# Patient Record
Sex: Male | Born: 1985 | Race: White | Hispanic: No | Marital: Single | Smoking: Current every day smoker
Health system: Southern US, Community
[De-identification: ages and names within clinical notes are randomized; demographics above are authoritative.]

## PROBLEM LIST (undated history)

## (undated) DIAGNOSIS — R52 Pain, unspecified: Secondary | ICD-10-CM

---

## 2007-03-03 ENCOUNTER — Emergency Department (HOSPITAL_COMMUNITY): Admission: EM | Admit: 2007-03-03 | Discharge: 2007-03-04 | Payer: Self-pay | Admitting: Emergency Medicine

## 2007-04-01 ENCOUNTER — Emergency Department (HOSPITAL_COMMUNITY): Admission: EM | Admit: 2007-04-01 | Discharge: 2007-04-01 | Payer: Self-pay | Admitting: Emergency Medicine

## 2007-07-03 ENCOUNTER — Emergency Department (HOSPITAL_COMMUNITY): Admission: EM | Admit: 2007-07-03 | Discharge: 2007-07-04 | Payer: Self-pay | Admitting: Emergency Medicine

## 2007-07-06 ENCOUNTER — Emergency Department (HOSPITAL_COMMUNITY): Admission: EM | Admit: 2007-07-06 | Discharge: 2007-07-06 | Payer: Self-pay | Admitting: Emergency Medicine

## 2008-12-25 IMAGING — CR DG CHEST 1V PORT
2 series · 2 of 2 positions shown · non-contrast
Comparison: None.

CLINICAL DATA: Right-sided chest pain. Shortness of breath.

PORTABLE CHEST - 1 VIEW  [DATE]/4333 [id number] hours:

[view not recorded (1 of 2)]
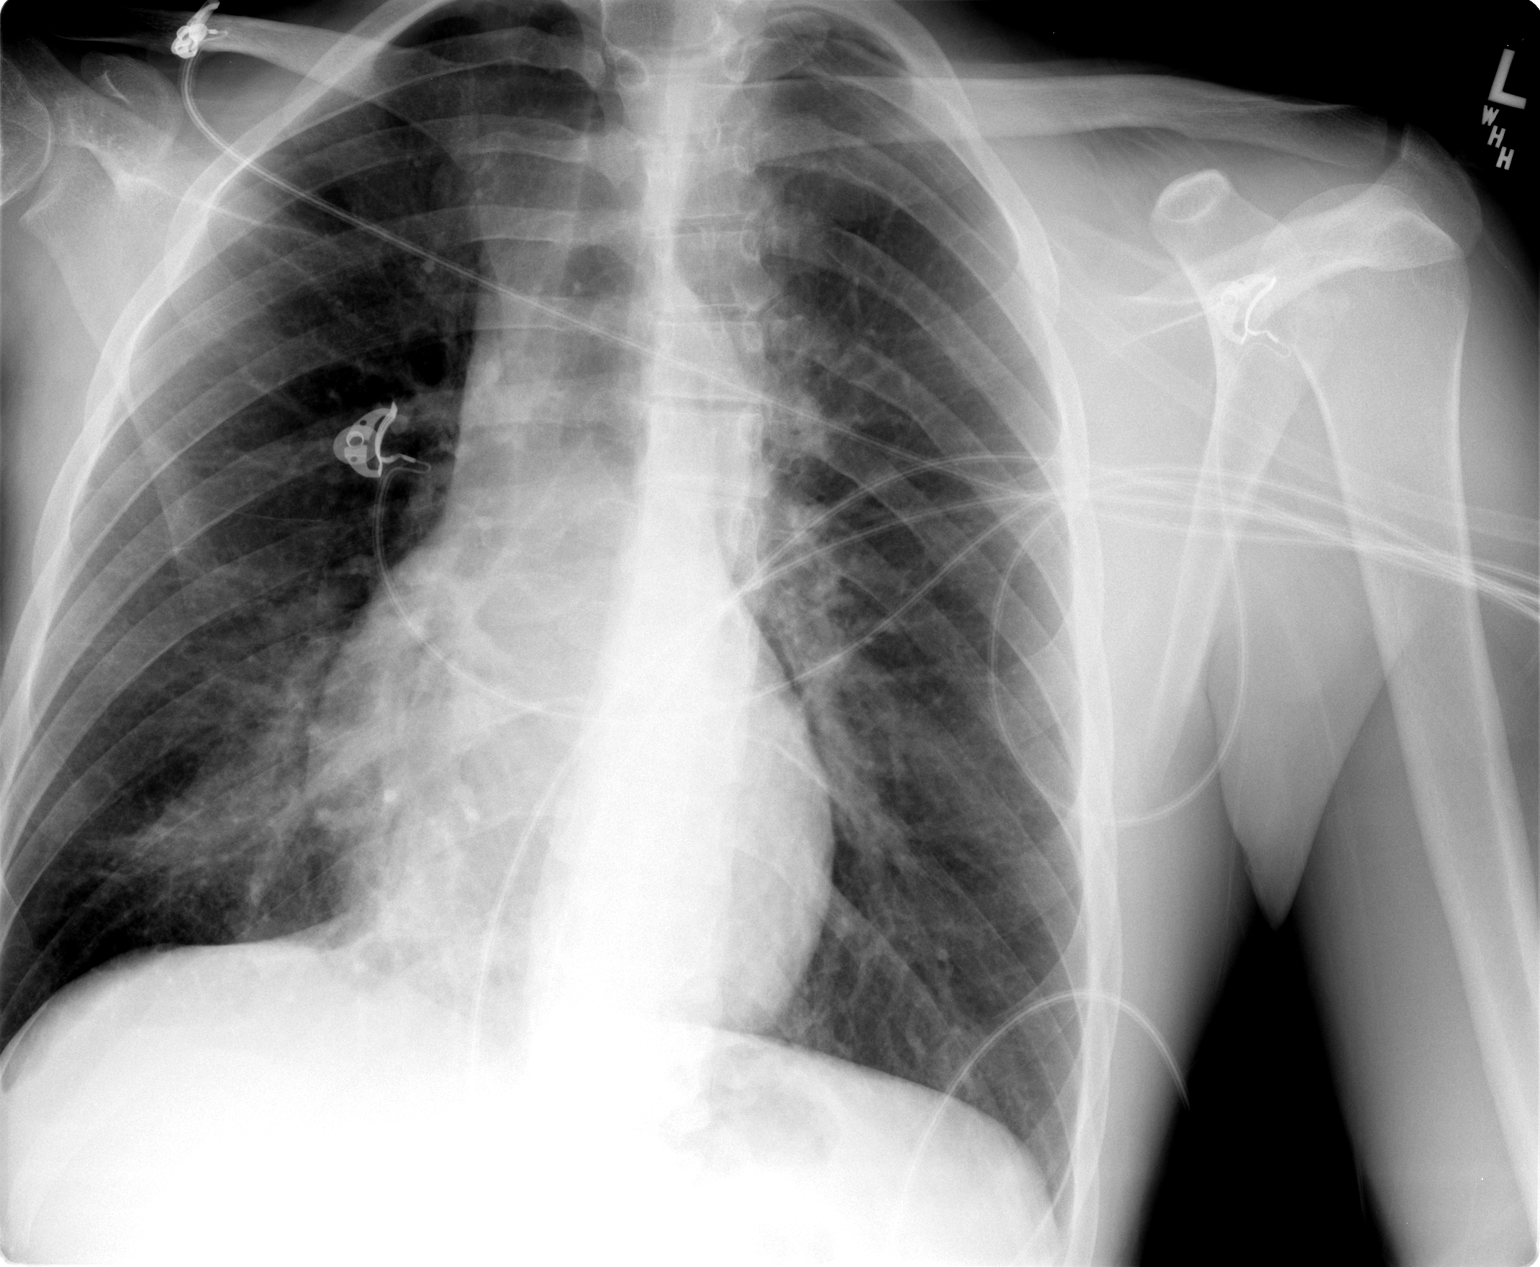

[view not recorded (2 of 2)]
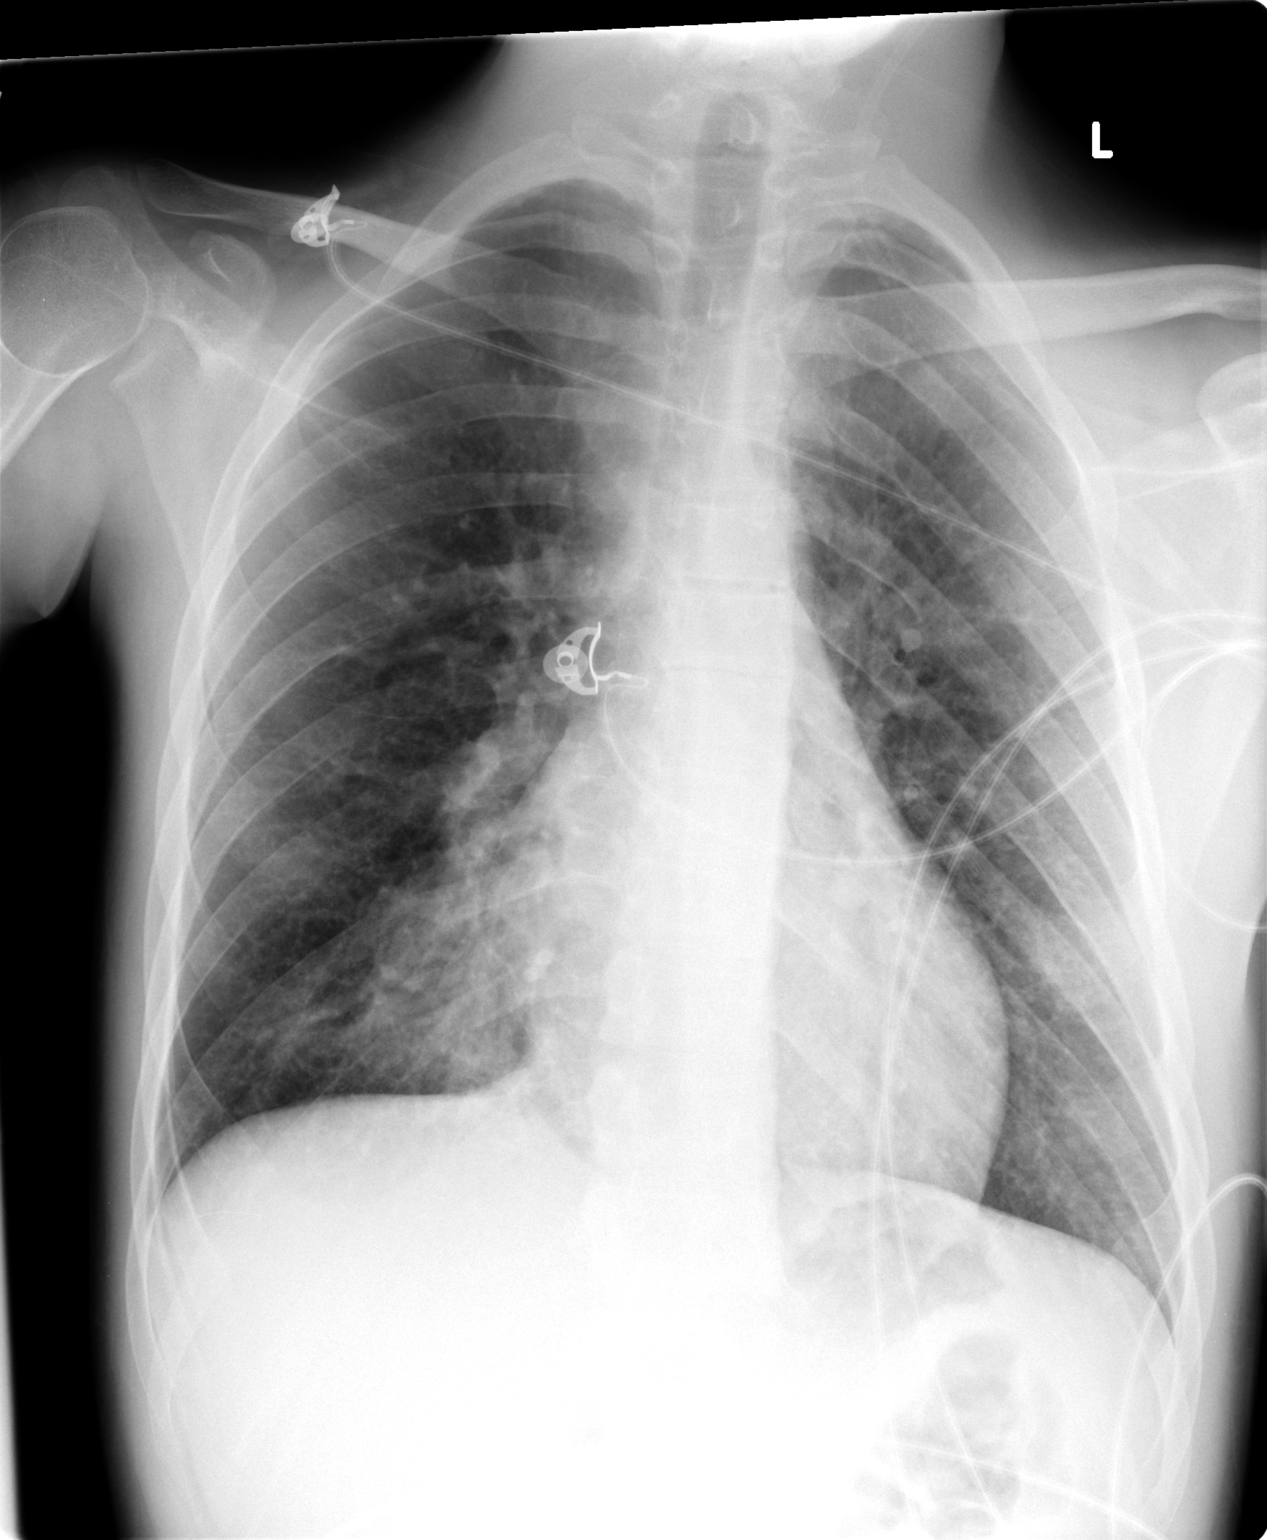

[2 of 2 positions shown; findings below may reference images not displayed]

FINDINGS: Airspace consolidation in the right middle lobe with silhouetting of
the right heart border. Lungs otherwise clear. No pleural effusions.
Cardiomediastinal silhouette unremarkable for technique.
IMPRESSION: Right middle lobe pneumonia.

## 2012-07-28 ENCOUNTER — Emergency Department (HOSPITAL_COMMUNITY)
Admission: EM | Admit: 2012-07-28 | Discharge: 2012-07-29 | Disposition: A | Discharge status: Home / Self Care | Payer: Medicaid Other | Attending: Emergency Medicine | Admitting: Emergency Medicine

## 2012-07-28 ENCOUNTER — Encounter (HOSPITAL_COMMUNITY): Payer: Self-pay | Admitting: Emergency Medicine

## 2012-07-28 DIAGNOSIS — R0789 Other chest pain: Secondary | ICD-10-CM

## 2012-07-28 DIAGNOSIS — S48119A Complete traumatic amputation at level between unspecified shoulder and elbow, initial encounter: Secondary | ICD-10-CM | POA: Insufficient documentation

## 2012-07-28 DIAGNOSIS — F17200 Nicotine dependence, unspecified, uncomplicated: Secondary | ICD-10-CM | POA: Insufficient documentation

## 2012-07-28 DIAGNOSIS — J189 Pneumonia, unspecified organism: Secondary | ICD-10-CM | POA: Insufficient documentation

## 2012-07-28 DIAGNOSIS — R079 Chest pain, unspecified: Secondary | ICD-10-CM | POA: Insufficient documentation

## 2012-07-28 DIAGNOSIS — R42 Dizziness and giddiness: Secondary | ICD-10-CM | POA: Insufficient documentation

## 2012-07-28 NOTE — ED Notes (Signed)
PER EMS- Pt found on floor where he resides. Pt was alertx4, NAD. Pt c/o chest pressure that radiates down L arm. Patient denies n/v/d. No SOB noted. Friend on scene states that patient was drinking alcohol mixed with some sort of energy drink. Pt currently alertx4, NAD.

## 2012-07-29 ENCOUNTER — Emergency Department (HOSPITAL_COMMUNITY): Payer: Medicaid Other

## 2012-07-29 LAB — URINALYSIS, ROUTINE W REFLEX MICROSCOPIC
Glucose, UA: NEGATIVE mg/dL
Ketones, ur: NEGATIVE mg/dL
Leukocytes, UA: NEGATIVE
Nitrite: NEGATIVE
Protein, ur: NEGATIVE mg/dL
pH: 6.5 (ref 5.0–8.0)

## 2012-07-29 LAB — CBC
HCT: 35.7 % — ABNORMAL LOW (ref 39.0–52.0)
MCH: 32.5 pg (ref 26.0–34.0)
MCV: 96.7 fL (ref 78.0–100.0)
Platelets: 139 10*3/uL — ABNORMAL LOW (ref 150–400)
RDW: 13 % (ref 11.5–15.5)

## 2012-07-29 LAB — COMPREHENSIVE METABOLIC PANEL
AST: 15 U/L (ref 0–37)
Albumin: 3.6 g/dL (ref 3.5–5.2)
BUN: 12 mg/dL (ref 6–23)
Calcium: 9.3 mg/dL (ref 8.4–10.5)
Creatinine, Ser: 0.77 mg/dL (ref 0.50–1.35)
GFR calc non Af Amer: >90 mL/min (ref >90)

## 2012-07-29 LAB — MAGNESIUM: Magnesium: 1.7 mg/dL (ref 1.5–2.5)

## 2012-07-29 LAB — RAPID URINE DRUG SCREEN, HOSP PERFORMED
Amphetamines: NOT DETECTED
Opiates: NOT DETECTED

## 2012-07-29 LAB — D-DIMER, QUANTITATIVE: D-Dimer, Quant: <0.27 ug/mL-FEU (ref 0.00–0.48)

## 2012-07-29 LAB — ETHANOL: Alcohol, Ethyl (B): <11 mg/dL (ref 0–11)

## 2012-07-29 LAB — POCT I-STAT TROPONIN I: Troponin i, poc: 0 ng/mL (ref 0.00–0.08)

## 2012-07-29 MED ORDER — AZITHROMYCIN 250 MG PO TABS: 250.0000 mg | ORAL_TABLET | Freq: Every day | ORAL | Status: DC

## 2012-07-29 MED ORDER — SODIUM CHLORIDE 0.9 % IV BOLUS (SEPSIS)
1000.0000 mL | Freq: Once | INTRAVENOUS | Status: AC
  Administered 2012-07-29: 1000 mL via INTRAVENOUS

## 2012-07-29 MED ORDER — LEVOFLOXACIN 750 MG PO TABS
750.0000 mg | ORAL_TABLET | Freq: Once | ORAL | Status: AC
  Administered 2012-07-29: 750 mg via ORAL
  Filled 2012-07-29: qty 1

## 2012-07-29 MED ORDER — AZITHROMYCIN 250 MG PO TABS
500.0000 mg | ORAL_TABLET | Freq: Every day | ORAL | Status: AC
  Administered 2012-07-29: 500 mg via ORAL
  Filled 2012-07-29: qty 3

## 2012-07-29 MED ORDER — CLOPIDOGREL BISULFATE 75 MG PO TABS: 75.0000 mg | ORAL_TABLET | Freq: Once | ORAL | Status: DC

## 2012-07-29 MED ORDER — AZITHROMYCIN 250 MG PO TABS: ORAL_TABLET | ORAL | Status: DC

## 2012-07-29 NOTE — ED Notes (Signed)
Walked pt around nurses station, pt tolerated
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
## 2014-07-28 ENCOUNTER — Inpatient Hospital Stay: Admit: 2014-07-28 | Discharge: 2014-07-28 | Disposition: A | Discharge status: Home / Self Care

## 2014-07-28 MED ORDER — NAPROXEN 500 MG PO TABS
500 MG | ORAL_TABLET | Freq: Two times a day (BID) | ORAL | Status: DC
Start: 2014-07-28 — End: 2014-12-10

## 2014-07-28 MED ORDER — HYDROCODONE-ACETAMINOPHEN 5-325 MG PO TABS
5-325 MG | ORAL_TABLET | Freq: Four times a day (QID) | ORAL | Status: AC | PRN
Start: 2014-07-28 — End: 2014-07-31

## 2014-07-28 MED ADMIN — HYDROcodone-acetaminophen (NORCO) 5-325 MG per tablet 1 tablet: 1 | ORAL | @ 20:00:00 | NDC 00406012323

## 2014-07-28 MED FILL — HYDROCODONE-ACETAMINOPHEN 5-325 MG PO TABS: 5-325 MG | ORAL | Qty: 1

## 2014-07-28 NOTE — ED Notes (Signed)
Thinks he sprained left ankle, stepped off a step wrong.     Virgina Norfolkebecca M Babs Dabbs, RN  07/28/14 [phone]

## 2014-07-28 NOTE — Discharge Instructions (Signed)
Ankle Sprain: Care Instructions  Your Care Instructions     An ankle sprain can happen when you twist your ankle. The ligaments that support the ankle can get stretched and torn. Often the ankle is swollen and painful.  Ankle sprains may take from several weeks to several months to heal. Usually, the more pain and swelling you have, the more severe your ankle sprain is and the longer it will take to heal. You can heal faster and regain strength in your ankle with good home treatment.  It is very important to give your ankle time to heal completely, so that you do not easily hurt your ankle again.  Follow-up care is a key part of your treatment and safety. Be sure to make and go to all appointments, and call your doctor if you are having problems. It's also a good idea to know your test results and keep a list of the medicines you take.  How can you care for yourself at home?   Prop up your foot on pillows as much as possible for the next 3 days. Try to keep your ankle above the level of your heart. This will help reduce the swelling.   Follow your doctor's directions for wearing a splint or elastic bandage. Wrapping the ankle may help reduce or prevent swelling.   Your doctor may give you a splint, a brace, an air stirrup, or another form of ankle support to protect your ankle until it is healed. Wear it as directed while your ankle is healing. Do not remove it unless your doctor tells you to. After your ankle has healed, ask your doctor whether you should wear the brace when you exercise.   Put ice or cold packs on your injured ankle for 10 to 20 minutes at a time. Try to do this every 1 to 2 hours for the next 3 days (when you are awake) or until the swelling goes down. Put a thin cloth between the ice and your skin.   You may need to use crutches until you can walk without pain. If you do use crutches, try to bear some weight on your injured ankle if you can do so without pain. This helps the ankle  heal.   Take pain medicines exactly as directed.   If the doctor gave you a prescription medicine for pain, take it as prescribed.   If you are not taking a prescription pain medicine, ask your doctor if you can take an over-the-counter medicine.   If you have been given ankle exercises to do at home, do them exactly as instructed. These can promote healing and help prevent lasting weakness.  When should you call for help?  Call your doctor now or seek immediate medical care if:   Your pain is getting worse.   Your swelling is getting worse.   Your splint feels too tight or you are unable to loosen it.  Watch closely for changes in your health, and be sure to contact your doctor if:   You are not getting better after 1 week.   Where can you learn more?   Go to [email or web address] and sign in to your MyChart account. Enter S771 in the Search Health Information box to learn more about "Ankle Sprain: Care Instructions."    If you do not have an account, please click on the "Sign Up Now" link.      2006-2015 [employer]. Care instructions adapted under license by [facility]. This care   instruction is for use with your licensed healthcare professional. If you have questions about a medical condition or this instruction, always ask your healthcare professional. [employer], Inc[employer] disclaims any warranty or liability for your use of this information.  Content Version: 10.6.465758; Current as of: Mar 20, 2014

## 2014-07-28 NOTE — ED Provider Notes (Signed)
[facility]      29M Emergency??  Department of Emergency Medicine ED  Provider Note  Admit Date/RoomTime: 07/28/2014  3:52 PM  ED Room: 29/29  9/29/154:18 PM    Chief Complaint:   No chief complaint on file.    History of Present Illness   Source of history provided by:  patient.  History/Exam Limitations: none.        Klayten Swaziland is a 28 y.o. old male presenting to the emergency department by private vehicle, for left ankle pain which occured 2 day(s) prior to arrival.  Cause of complaint: twisting injury while at home.  There has been a history of no prior problems with this area in the past.  Since onset the symptoms have been moderate in degree with ability to bear weight, but with some pain, bruising and swelling.       ROS    Pertinent positives and negatives are stated within HPI, all other systems reviewed and are negative.    Past Surgical History   Procedure Laterality Date   ??? Arm amputation       Rt above elbow age 56   Social History:  reports that he has been smoking Cigarettes.  He has been smoking about 0.50 packs per day. He does not have any smokeless tobacco history on file. He reports that he does not drink alcohol or use illicit drugs.  Family History: family history is not on file.   Allergies: Tramadol    Physical Exam         VS:  BP 101/47 mmHg   Pulse 103   Temp(Src) 98.2 ??F (36.8 ??C) (Oral)   Resp 18   Ht 6\' 2"  (1.88 m)   Wt 135 lb (61.236 kg)   BMI 17.33 kg/m2   SpO2 100%    Oxygen Saturation Interpretation: Normal.    Constitutional:  Alert, development consistent with age.  Neck:  Normal ROM.  Supple.   Ankle:  Left Lateral:              Tenderness:  moderate.              Swelling: Mild.              Deformity: no.             ROM: diminished range with pain.             Skin:  tenderness, swelling and bruising.       Neurovascular:              Motor deficit: none.             Sensory deficit:   none.            Pulse deficit: none.              Capillary refill: normal.  Knee:               Tenderness:  none.              Swelling: None.              Deformity: no.             ROM: full range of motion.             Skin:  no erythema, rash or wounds noted.   Foot:              Tenderness:  mild.  Swelling: None.              Deformity: no.             ROM: diminished range with pain.             Skin:  no erythema, rash or wounds noted.  Gait:  limp.   Lymphatics: No lymphangitis or adenopathy noted.  Neurological:  Oriented.  Motor functions intact.    Lab / Imaging Results   (All laboratory and radiology results have been personally reviewed by myself)  Labs:  No results found for this visit on 07/28/14.  Imaging:  All Radiology results interpreted by Radiologist unless otherwise noted.  XR ANKLE LEFT STANDARD    Final Result: IMPRESSION: No evidence of any acute fracture or subluxation.       XR FOOT LEFT STANDARD    Final Result: IMPRESSION: No evidence of any acute fracture or subluxation.         ED Course / Medical Decision Making     Medications   HYDROcodone-acetaminophen (NORCO) 5-325 MG per tablet 1 tablet (1 tablet Oral Given 07/28/14 1624)     Consults:   None    Procedure(s):   none    MDM:       Films were obtained and are negative for fracture. Plan is subsequently for symptom control, limited weight-bearing and  immobilization with appropriate outpatient follow-up.    Counseling:   The emergency provider has spoken with the patient and discussed today???s results, in addition to providing specific details for the plan of care and counseling regarding the diagnosis and prognosis.  Questions are answered at this time and they are agreeable with the plan.    Assessment      1. Sprain of ankle, unspecified site      Plan   Discharge to home  Patient condition is good    New Medications     New Prescriptions    HYDROCODONE-ACETAMINOPHEN (NORCO) 5-325 MG PER TABLET    Take 1 tablet by mouth every 6 hours as needed for Pain    NAPROXEN (NAPROSYN) 500 MG TABLET    Take 1  tablet by mouth 2 times daily for 7 days     Electronically signed by Tarry KosElizabeth Destaney Sarkis, NP   DD: 07/28/14  **This report was transcribed using voice recognition software. Every effort was made to ensure accuracy; however, inadvertent computerized transcription errors may be present.  END OF ED PROVIDER NOTE    Tarry KosElizabeth Nathasha Fiorillo, NP  07/28/14 1706

## 2014-11-09 ENCOUNTER — Inpatient Hospital Stay
Admit: 2014-11-09 | Discharge: 2014-11-10 | Disposition: A | Discharge status: Home / Self Care | Attending: Emergency Medicine

## 2014-11-09 ENCOUNTER — Encounter

## 2014-11-09 ENCOUNTER — Encounter: Admit: 2014-11-09

## 2014-11-09 DIAGNOSIS — R569 Unspecified convulsions: Secondary | ICD-10-CM

## 2014-11-09 LAB — URINE DRUG SCREEN
Amphetamine Screen, Urine: NOT DETECTED (ref <1000)
Barbiturate Screen, Ur: NOT DETECTED (ref <200)
Benzodiazepine Screen, Urine: NOT DETECTED (ref <200)
Cannabinoid Scrn, Ur: POSITIVE — AB
Cocaine Metabolite Screen, Urine: NOT DETECTED (ref <300)
Methadone Screen, Urine: NOT DETECTED (ref <300)
Opiate Scrn, Ur: NOT DETECTED
PCP Screen, Urine: NOT DETECTED (ref <25)
Propoxyphene Scrn, Ur: NOT DETECTED (ref <300)

## 2014-11-09 LAB — CBC WITH AUTO DIFFERENTIAL
Basophils %: 1 % (ref 0–2)
Basophils Absolute: 0.02 E9/L (ref 0.00–0.20)
Eosinophils %: 1 % (ref 0–6)
Eosinophils Absolute: 0.05 E9/L (ref 0.05–0.50)
Hematocrit: 36.7 % — ABNORMAL LOW (ref 37.0–54.0)
Hemoglobin: 12.4 g/dL — ABNORMAL LOW (ref 12.5–16.5)
Lymphocytes %: 54 % — ABNORMAL HIGH (ref 20–42)
Lymphocytes Absolute: 1.98 E9/L (ref 1.50–4.00)
MCH: 32.4 pg (ref 26.0–35.0)
MCHC: 33.6 % (ref 32.0–34.5)
MCV: 96.4 fL (ref 80.0–99.9)
MPV: 7.8 fL (ref 7.0–12.0)
Monocytes %: 6 % (ref 2–12)
Monocytes Absolute: 0.23 E9/L (ref 0.10–0.95)
Neutrophils %: 38 % — ABNORMAL LOW (ref 43–80)
Neutrophils Absolute: 1.41 E9/L — ABNORMAL LOW (ref 1.80–7.30)
Platelets: 156 E9/L (ref 130–450)
RBC: 3.81 E12/L (ref 3.80–5.80)
RDW: 14 fL (ref 11.5–15.0)
WBC: 3.7 E9/L — ABNORMAL LOW (ref 4.5–11.5)

## 2014-11-09 LAB — COMPREHENSIVE METABOLIC PANEL
ALT: 10 U/L (ref 0–40)
AST: 16 U/L (ref 0–39)
Albumin: 4.4 g/dL (ref 3.5–5.2)
Alkaline Phosphatase: 68 U/L (ref 40–129)
Anion Gap: 13 mmol/L (ref 7–16)
BUN: 10 mg/dL (ref 6–20)
CO2: 25 mmol/L (ref 22–29)
Calcium: 9.6 mg/dL (ref 8.6–10.2)
Chloride: 105 mmol/L (ref 98–107)
Creatinine: 0.8 mg/dL (ref 0.7–1.2)
GFR African American: >60
GFR Non-African American: >60 mL/min/{1.73_m2} (ref >=60)
Glucose: 106 mg/dL (ref 74–109)
Potassium: 3.8 mmol/L (ref 3.5–5.0)
Sodium: 143 mmol/L (ref 132–146)
Total Bilirubin: 0.7 mg/dL (ref 0.0–1.2)
Total Protein: 7.2 g/dL (ref 6.4–8.3)

## 2014-11-09 LAB — URINALYSIS
Bilirubin Urine: NEGATIVE
Blood, Urine: NEGATIVE
Glucose, Ur: NEGATIVE mg/dL
Ketones, Urine: NEGATIVE mg/dL
Leukocyte Esterase, Urine: NEGATIVE
Nitrite, Urine: NEGATIVE
Protein, UA: NEGATIVE mg/dL
Specific Gravity, UA: <=1.005 (ref 1.005–1.030)
Urobilinogen, Urine: 0.2 E.U./dL (ref <2.0)
pH, UA: 5.5 (ref 5.0–9.0)

## 2014-11-09 LAB — SERUM DRUG SCREEN
Acetaminophen Level: <15 ug/mL (ref 10.0–30.0)
Ethanol Lvl: <10 mg/dL
Salicylate, Serum: <0.3 mg/dL (ref 0.0–30.0)
TCA Scrn: NEGATIVE ng/mL

## 2014-11-09 LAB — LIPASE: Lipase: 14 U/L (ref 13–60)

## 2014-11-09 LAB — LACTIC ACID: Lactic Acid: 0.8 mmol/L (ref 0.5–2.2)

## 2014-11-09 MED ORDER — SODIUM CHLORIDE 0.9 % IV BOLUS
0.9 % | Freq: Once | INTRAVENOUS | Status: AC
Start: 2014-11-09 — End: 2014-11-09
  Administered 2014-11-09: 22:00:00 1000 mL via INTRAVENOUS

## 2014-11-09 MED ORDER — DIAZEPAM 5 MG/ML IJ SOLN
5 MG/ML | INTRAMUSCULAR | Status: DC
Start: 2014-11-09 — End: 2014-11-09

## 2014-11-09 MED ORDER — ONDANSETRON HCL 4 MG/2ML IJ SOLN
4 MG/2ML | Freq: Once | INTRAMUSCULAR | Status: AC
Start: 2014-11-09 — End: 2014-11-09
  Administered 2014-11-09: 22:00:00 4 mg via INTRAVENOUS

## 2014-11-09 MED ORDER — LORAZEPAM 2 MG/ML IJ SOLN
2 MG/ML | Freq: Once | INTRAMUSCULAR | Status: AC
Start: 2014-11-09 — End: 2014-11-09
  Administered 2014-11-09: 23:00:00 2 mg via INTRAVENOUS

## 2014-11-09 MED ORDER — KETOROLAC TROMETHAMINE 30 MG/ML IJ SOLN
30 MG/ML | Freq: Once | INTRAMUSCULAR | Status: AC
Start: 2014-11-09 — End: 2014-11-09
  Administered 2014-11-09: 22:00:00 30 mg via INTRAVENOUS

## 2014-11-09 MED ORDER — DIAZEPAM 5 MG/ML IJ SOLN
5 MG/ML | Freq: Once | INTRAMUSCULAR | Status: AC
Start: 2014-11-09 — End: 2014-11-09
  Administered 2014-11-09: 23:00:00 5 mg via INTRAVENOUS

## 2014-11-09 MED FILL — DIAZEPAM 5 MG/ML IJ SOLN: 5 MG/ML | INTRAMUSCULAR | Qty: 2

## 2014-11-09 MED FILL — KETOROLAC TROMETHAMINE 30 MG/ML IJ SOLN: 30 MG/ML | INTRAMUSCULAR | Qty: 1

## 2014-11-09 MED FILL — ONDANSETRON HCL 4 MG/2ML IJ SOLN: 4 MG/2ML | INTRAMUSCULAR | Qty: 2
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
## 2014-11-09 NOTE — ED Notes (Signed)
Seizure pads on bed.    Daiva Evesara Nameer Summer, RN  11/09/14 [phone]

## 2014-11-09 NOTE — ED Notes (Addendum)
Pt states that last night he had a seizure and fell out of bed and hit his head. Pt has a small abrasion to his forehead. [facility]. Wife states that pt [facility] and that "everything came back okay". Pt states he does not take medication for seizures but he does have a history of seizures. Pt states he hasn't taken medication for a year. Wife states that pt had a seizure [facility]. *Pt became uncooperative during exam and states he "no longer wants to talk".     Daiva Evesara Jovin Fester, RN  11/09/14 1639    Daiva Evesara Damian Hofstra, RN  11/09/14 [phone]
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
## 2014-11-09 NOTE — ED Notes (Signed)
Pt had a seizure in CT. Pt brought back to the room. Pt shaking legs and arms. Pt seizing pauses with sternal rub.     Daiva Evesara Nitara Szczerba, RN  11/09/14 [phone]

## 2014-11-10 MED ORDER — LEVETIRACETAM 500 MG PO TABS
500 MG | ORAL_TABLET | Freq: Two times a day (BID) | ORAL | Status: DC
Start: 2014-11-10 — End: 2014-12-10

## 2014-11-10 MED ORDER — LEVETIRACETAM 500 MG PO TABS
500 MG | Freq: Once | ORAL | Status: AC
Start: 2014-11-10 — End: 2014-11-09
  Administered 2014-11-10: 02:00:00 500 mg via ORAL

## 2014-11-10 MED FILL — LEVETIRACETAM 500 MG PO TABS: 500 MG | ORAL | Qty: 1

## 2014-11-11 ENCOUNTER — Encounter: Attending: Family Medicine

## 2014-11-16 LAB — CANNABINOID, URINE, CONFIRMATION: Cannabinoids Conf Ur: 304 ng/mL

## 2014-12-10 ENCOUNTER — Encounter: Admit: 2014-12-10

## 2014-12-10 ENCOUNTER — Inpatient Hospital Stay: Admit: 2014-12-10 | Discharge: 2014-12-10 | Disposition: A | Discharge status: Home / Self Care

## 2014-12-10 DIAGNOSIS — T8789 Other complications of amputation stump: Secondary | ICD-10-CM

## 2014-12-10 MED ORDER — KETOROLAC TROMETHAMINE 60 MG/2ML IM SOLN
60 MG/2ML | INTRAMUSCULAR | Status: AC
Start: 2014-12-10 — End: 2014-12-10
  Administered 2014-12-10: 18:00:00 60 via INTRAMUSCULAR

## 2014-12-10 MED ORDER — KETOROLAC TROMETHAMINE 60 MG/2ML IM SOLN
60 MG/2ML | Freq: Once | INTRAMUSCULAR | Status: AC
Start: 2014-12-10 — End: 2014-12-10

## 2014-12-10 MED ORDER — DICLOFENAC SODIUM 75 MG PO TBEC
75 MG | ORAL_TABLET | Freq: Two times a day (BID) | ORAL | Status: DC
Start: 2014-12-10 — End: 2015-03-24

## 2014-12-10 MED FILL — KETOROLAC TROMETHAMINE 60 MG/2ML IM SOLN: 60 MG/2ML | INTRAMUSCULAR | Qty: 2

## 2014-12-10 NOTE — ED Provider Notes (Signed)
[facility]      ??  Department of Emergency Medicine   ED  Provider Note  Admit Date/RoomTime: 12/10/2014 12:26 PM  ED Room: 30/30  Chief Complaint:   Arm Pain    History of Present Illness   Source of history provided by:  patient.  History/Exam Limitations: none.      [name] [name] is a 29 y.o. old male with a past medical history of:   Past Medical History   Diagnosis Date   ??? 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   ??? Seizures (HCC)     presents to the emergency department by private vehicle, for Right upper arm pain which occured 2 month(s) prior to arrival.  Cause of complaint: none while at home.  Previous injury: yes- the patient fell in front of a lawn tractor at age 89 and had an amputation of the right upper extremity. Since onset the symptoms have been moderate in degree.  His pain is aggraveated by palpation and relieved by nothing. This patient states that he is noticed pain to the stump of his right upper extremity. It has worsened over the past 2 months. His family physician was giving him hydrocodone for his pain. He has an appointment to see Dr. Charm BargesButler on March 16 for evaluation of this condition. He states that he was in contact with his primary care physician today, but she can no longer help him with his chronic pain needs. He denies fevers, chills or other concerns. No recent falls.  He states "I cannot wait until March 16 to be seen". He is requesting another resource for orthopedic physician.     ROS   Pertinent positives and negatives are stated within HPI, all other systems reviewed and are negative.    Past Surgical History   Procedure Laterality Date   ??? Arm amputation       Rt above elbow age 89   ??? Arm amputation Right    Social History:  reports that he has been smoking Cigarettes.  He has been smoking about 0.50 packs per day. He does not have any smokeless tobacco history on file. He reports that he does not drink alcohol or use  illicit drugs.  Family History: family history is not on file.   Allergies: Tramadol    Physical Exam         VS:  BP 128/66 mmHg   Pulse 74   Resp 16   Ht 6\' 2"  (1.88 m)   Wt 135 lb (61.236 kg)   BMI 17.33 kg/m2   SpO2 95%   Oxygen Saturation Interpretation: Normal.    ?? Constitutional:  Alert, development consistent with age.  ?? HEENT:  NC/NT.  Airway patent.  ?? Neck:  Normal ROM.  Supple.  ?? Extremity(s):  Right: upper arm.                Tenderness:  moderate.                Swelling: None.              Deformity: previous amputation above the right elbow              Skin:  no erythema, rash or wounds noted.              Neurovascular:  Motor deficit: none.                  Sensory deficit: none.       Neurovascularly intact to the right upper extremity.   ?? Lymphatics: No lymphangitis or adenopathy noted.  ?? Neurological:  Oriented.  Motor functions intact.    Lab / Imaging Results   (All laboratory and radiology results have been personally reviewed by myself)  Labs:  No results found for this visit on 12/10/14.    Imaging:  All Radiology results interpreted by Radiologist unless otherwise noted.  XR Humerus Right Standard   Final Result   IMPRESSION: Amputation of the distal right humerus        ED Course / Medical Decision Making     Medications   ketorolac (TORADOL) injection 60 mg (60 mg Intramuscular Given 12/10/14 1252)     Consult:   None    Procedure(s):   none    MDM:       Films were obtained and are negative for fracture. Plan is subsequently for symptom control, limited use and  immobilization with appropriate outpatient follow-up.This patient was advised that we are not able to write narcotics for chronic pain conditions. He'll be given anti-inflammatories. He was advised to follow-up with Dr. Charm Barges was given resources for [facility] orthopedic clinic as per his request. All narcotic pain medication must be obtained through his family physician or orthopedist. There is no  evidence for cellulitis or infection on physical examination.    Counseling:   The emergency provider has spoken with the patient and discussed today???s results, in addition to providing specific details for the plan of care and counseling regarding the diagnosis and prognosis.  Questions are answered at this time and they are agreeable with the plan.    Assessment      1. Amputation stump pain (HCC)      Plan   Discharge to home  Patient condition is stable    New Medications     Discharge Medication List as of 12/10/2014  1:39 PM      START taking these medications    Details   diclofenac (VOLTAREN) 75 MG EC tablet Take 1 tablet by mouth 2 times daily for 7 days, Disp-14 tablet, R-0           Electronically signed by Casimer Leek, PA   DD: 12/10/14  **This report was transcribed using voice recognition software. Every effort was made to ensure accuracy; however, inadvertent computerized transcription errors may be present.  END OF ED PROVIDER NOTE      Casimer Leek, [place]  12/10/14 1438

## 2015-01-11 ENCOUNTER — Ambulatory Visit: Admit: 2015-01-11 | Discharge: 2015-01-11 | Payer: PRIVATE HEALTH INSURANCE | Attending: Orthopaedic Surgery

## 2015-01-11 DIAGNOSIS — M79601 Pain in right arm: Secondary | ICD-10-CM

## 2015-01-11 MED ORDER — ACETAMINOPHEN-CODEINE 300-30 MG PO TABS
300-30 MG | ORAL_TABLET | ORAL | Status: DC | PRN
Start: 2015-01-11 — End: 2015-01-20

## 2015-01-11 NOTE — Progress Notes (Signed)
Department of Orthopedic Surgery   H&P Note          CHIEF COMPLAINT:  Right humeral stump pain     HISTORY OF PRESENT ILLNESS:                The patient is a 29 y.o. male who presents with right humeral stump pain that has been present for several years.  He last had an operation back 4-5 years ago in [place] where a revision of his stump was performed in attempt to decrease the pain over the site.  He has been dealing with psychiatric issues recently and was at a [facility] facility for 1 1/2 weeks for treatment.  He states that the stump is sensitive to touch and has prevented him from using his prosthesis.      Past Medical History:        Diagnosis Date   . 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   . Seizures (HCC)      Past Surgical History:        Procedure Laterality Date   . Arm amputation       Rt above elbow age 62   . Arm amputation Right      Current Medications:   No current facility-administered medications for this visit.  Allergies:  Tramadol    Social History:   TOBACCO:   reports that he quit smoking about 4 weeks ago. His smoking use included Cigarettes. He smoked 0.50 packs per day. He uses smokeless tobacco.  ETOH:   reports that he does not drink alcohol.  DRUGS:   reports that he does not use illicit drugs.  ACTIVITIES OF DAILY LIVING:    OCCUPATION:    Family History:   History reviewed. No pertinent family history.    REVIEW OF SYSTEMS:  CONSTITUTIONAL:  negative for  fevers and chills  HEENT:  negative for  headache  RESPIRATORY:  negative for  chest pain  CARDIOVASCULAR:  negative for  chest pain, dyspnea  GASTROINTESTINAL:  negative for nausea and vomiting  MUSCULOSKELETAL:  positive for  pain  NEUROLOGICAL:  positive for pain and tingling    PHYSICAL EXAM:    VITALS:  BP 106/64 mmHg  Pulse 64  Temp(Src) 97.6 F (36.4 C) (Oral)  Resp 16  Ht  (1.88 m)  Wt 158 lb (71.668 kg)  BMI 20.28 kg/m2  CONSTITUTIONAL:  awake, alert,  cooperative, no apparent distress, and appears stated age  EYES:  extra-ocular muscles intact  ENT:  normocepalic, without obvious abnormality  NECK:  supple, symmetrical, trachea midline  LUNGS:  no increased work of breathing  CARDIOVASCULAR:  normal apical pulses  ABDOMEN:  soft  NEUROLOGIC:  Cranial Nerves:  cranial nerves II-XII are grossly intact  MUSCULOSKELETAL:  RUE   Humeral Diaphyseal mid to distal amputation with scar sites over anterior brachium   Tender to palpation with minimal soft tissue over distal bone stump  No ulceration or open wounds  Sensate stump circumferentially     DATA:    CBC:   Lab Results   Component Value Date    WBC 3.7 11/09/2014    RBC 3.81 11/09/2014    HGB 12.4 11/09/2014    HCT 36.7 11/09/2014    MCV 96.4 11/09/2014    MCH 32.4 11/09/2014    MCHC 33.6 11/09/2014    RDW 14.0 11/09/2014    PLT 156 11/09/2014  MPV 7.8 11/09/2014     PT/INR:  No results found for: PROTIME, INR  Radiology Review:  Right Humerus - mid to distal third humeral ampuation    IMPRESSION:  1.  Right Humeral Amputation Stump Sensitivity     PLAN:  1.  The patient has been dealing with both physical and emotional pain.  We discussed the importance of inter-disciplinary treatment which from an orthopedic standpoint would include revision amputation   2.  Patient was given a small amount of tylenol #3 prescription but ultimately will be referred to a pain management clinic      I have seen and evaluated the patient and agree with the above assessment and plan on today's visit. 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 Discussed possible revision amputation for sensitivity. Also further explained possible referral to pain management for continued ongoing stump pain. Further discussed with him that revision amputation may or may not improve his ongoing symptoms. After  discussion he wished proceed. I concur with the findings and plan as documented.      I explained the risks, benefits, alternatives and complications of surgery with the patient including but not limited to the risks of infection, possible damage to nerves, vessels, or tendons, stiffness, loss of range of motion, scar sensitivity, wound healing complications, worsening symptoms, possible need for therapy, as well as the possible need further surgery and unanticipated complications.  The patient voiced understanding and all questions were answered. The patient elected to proceed with surgical intervention.     Garnett Farmdrian L Caroleen Stoermer  01/11/2015      Garnett FarmAdrian L Johnrobert Foti, MD  01/11/2015

## 2015-01-15 NOTE — Patient Instructions (Signed)
[facility] PRE-ADMISSION TESTING INSTRUCTIONS    The Preadmission Testing patient is instructed accordingly using the following criteria (check applicable):    ARRIVAL INSTRUCTIONS:  [x] Parking the day of Surgery is located in the Main Entrance lot.  Upon entering the door, make an immediate right to the surgery reception desk    [x] Bring photo ID and insurance card    [] Bring in a copy of Living will or Durable Power of attorney papers.    [x] Please be sure to arrange transportation to and from the hospital    [x] Please arrange for someone to be with you the remainder of the day due to having anesthesia      GENERAL INSTRUCTIONS:    [x] Nothing by mouth after midnight, including gum, candy, mints or water    [x] You may brush your teeth, but do not swallow any water    [x] Take medications as instructed with 1-2 oz of water    [] Stop herbal supplements and vitamins 5 days prior to procedure    [] Follow preop dosing of blood thinners per physician instructions    [] Do not take insulin or oral diabetic medications    [] If diabetic and have low blood sugar or feel symptomatic, take 1-2oz apple juice or glucose tablets    [] Bring inhalers day of surgery    [] Bring C-PAP/ Bi-Pap day of surgery    [] Bring urine specimen day of surgery    [x] Antibacterial Soap shower or bath AM of Surgery, no lotion, powders or creams to surgical site    [] Follow bowel prep as instructed per surgeon    [x] No smoking within 24 hours of surgery     [x] No alcohol or illegal drug use within 24 hours of surgery.    [x] Jewelry, body piercing's, eyeglasses, contact lenses and dentures are not permitted into surgery (bring cases)      [] Please do not wear any nail polish or make up on the day of surgery    [x] If not already done, you can expect a call from registration    [x] If surgeon requests a time change you will be notified the day prior to surgery    [x] If you receive a survey after surgery we  would greatly appreciate your comments    [] Parent/guardian of a minor must accompany their child and remain on the premises  the entire time they are under our care     [] Pediatric patients may bring favorite toy, blanket or comfort item with them    [] A caregiver or family member must remain with the patient during their stay if they are mentally handicapped, have dementia, disoriented or unable to use a call light or would be a safety concern if left unattended    [x] Please notify surgeon if you develop any illness between now and time of surgery (cold, cough, sore throat, fever, nausea, vomiting) or any signs of infections  including skin, wounds, and dental.    [] Other instructions    EDUCATIONAL MATERIALS PROVIDED:    [] PAT Preoperative Education Packet/Booklet     [] Medication List    [] Fluoroscopy Information Pamphlet    [] Transfusion bracelet applied with instructions    [] Joint replacement video reviewed    [] Shower with antibacterial soap and use CHG wipes provided the evening before surgery as instructed

## 2015-01-20 ENCOUNTER — Inpatient Hospital Stay: Admit: 2015-01-20 | Payer: PRIVATE HEALTH INSURANCE | Attending: Orthopaedic Surgery

## 2015-01-20 MED ORDER — MEPERIDINE HCL 25 MG/ML IJ SOLN
25 MG/ML | INTRAMUSCULAR | Status: DC | PRN
Start: 2015-01-20 — End: 2015-01-21

## 2015-01-20 MED ORDER — CEFAZOLIN SODIUM-DEXTROSE 2-3 GM-% IV SOLR
2-3 GM-% | INTRAVENOUS | Status: AC
Start: 2015-01-20 — End: 2015-01-20

## 2015-01-20 MED ORDER — FENTANYL CITRATE 0.05 MG/ML IJ SOLN
0.05 MG/ML | INTRAMUSCULAR | Status: DC | PRN
Start: 2015-01-20 — End: 2015-01-21

## 2015-01-20 MED ORDER — OXYCODONE-ACETAMINOPHEN 5-325 MG PO TABS
5-325 MG | Freq: Once | ORAL | Status: AC | PRN
Start: 2015-01-20 — End: 2015-01-20
  Administered 2015-01-20: 18:00:00 1 via ORAL

## 2015-01-20 MED ORDER — DIPHENHYDRAMINE HCL 50 MG/ML IJ SOLN
50 MG/ML | Freq: Once | INTRAMUSCULAR | Status: AC | PRN
Start: 2015-01-20 — End: 2015-01-20

## 2015-01-20 MED ORDER — HYDROMORPHONE HCL 1 MG/ML IJ SOLN
1 MG/ML | INTRAMUSCULAR | Status: DC | PRN
Start: 2015-01-20 — End: 2015-01-21

## 2015-01-20 MED ORDER — PROMETHAZINE HCL 25 MG/ML IJ SOLN
25 MG/ML | INTRAMUSCULAR | Status: DC | PRN
Start: 2015-01-20 — End: 2015-01-21

## 2015-01-20 MED ORDER — CEFAZOLIN SODIUM-DEXTROSE 2-3 GM-% IV SOLR
2-3 GM-% | INTRAVENOUS | Status: AC
Start: 2015-01-20 — End: 2015-01-20
  Administered 2015-01-20: 16:00:00 2 g via INTRAVENOUS

## 2015-01-20 MED ORDER — BUPIVACAINE-EPINEPHRINE (PF) 0.5% -1:200000 IJ SOLN
INTRAMUSCULAR | Status: AC
Start: 2015-01-20 — End: ?

## 2015-01-20 MED ORDER — SODIUM CHLORIDE 0.9 % IV SOLN
0.9 % | INTRAVENOUS | Status: DC
Start: 2015-01-20 — End: 2015-01-21
  Administered 2015-01-20: 14:00:00 via INTRAVENOUS

## 2015-01-20 MED ORDER — NORMAL SALINE FLUSH 0.9 % IV SOLN
0.9 % | INTRAVENOUS | Status: DC | PRN
Start: 2015-01-20 — End: 2015-01-21

## 2015-01-20 MED ORDER — HYDROCODONE-ACETAMINOPHEN 5-325 MG PO TABS
5-325 MG | ORAL_TABLET | Freq: Four times a day (QID) | ORAL | Status: DC | PRN
Start: 2015-01-20 — End: 2015-02-02

## 2015-01-20 MED FILL — FENTANYL CITRATE 0.05 MG/ML IJ SOLN: 0.05 MG/ML | INTRAMUSCULAR | Qty: 2

## 2015-01-20 MED FILL — ROCURONIUM BROMIDE 50 MG/5ML IV SOLN: 50 MG/5ML | INTRAVENOUS | Qty: 5

## 2015-01-20 MED FILL — DIPRIVAN 10 MG/ML IV EMUL: 10 MG/ML | INTRAVENOUS | Qty: 20

## 2015-01-20 MED FILL — LIDOCAINE HCL (CARDIAC) 20 MG/ML IV SOLN: 20 MG/ML | INTRAVENOUS | Qty: 5

## 2015-01-20 MED FILL — OXYCODONE-ACETAMINOPHEN 5-325 MG PO TABS: 5-325 MG | ORAL | Qty: 1

## 2015-01-20 MED FILL — GLYCOPYRROLATE 0.2 MG/ML IJ SOLN: 0.2 MG/ML | INTRAMUSCULAR | Qty: 1

## 2015-01-20 MED FILL — ONDANSETRON HCL 4 MG/2ML IJ SOLN: 4 MG/2ML | INTRAMUSCULAR | Qty: 2

## 2015-01-20 MED FILL — SOJOURN IN SOLN: RESPIRATORY_TRACT | Qty: 250

## 2015-01-20 MED FILL — DEXAMETHASONE SODIUM PHOSPHATE 4 MG/ML IJ SOLN: 4 MG/ML | INTRAMUSCULAR | Qty: 1

## 2015-01-20 MED FILL — NEOSTIGMINE METHYLSULFATE 0.5 MG/ML IJ SOLN: 0.5 MG/ML | INTRAMUSCULAR | Qty: 10

## 2015-01-20 MED FILL — SENSORCAINE-MPF/EPINEPHRINE 0.5% -1:200000 IJ SOLN: INTRAMUSCULAR | Qty: 10

## 2015-01-20 MED FILL — MIDAZOLAM HCL 2 MG/2ML IJ SOLN: 2 MG/ML | INTRAMUSCULAR | Qty: 2

## 2015-01-20 MED FILL — CEFAZOLIN SODIUM-DEXTROSE 2-3 GM-% IV SOLR: 2-3 GM-% | INTRAVENOUS | Qty: 2

## 2015-01-20 NOTE — Anesthesia Post-Procedure Evaluation (Signed)
Department of Anesthesiology  Post-Anesthesia Note    Name:  [name] [name]                                         Age:  29 y.o.  MRN:  [id number]     Last Vitals:  BP 118/73 mmHg   Pulse 62   Temp(Src) 97.9 ??F (36.6 ??C) (Temporal)   Resp 16   Ht 6\' 2"  (1.88 m)   Wt 160 lb (72.576 kg)   BMI 20.53 kg/m2   SpO2 100%  Patient Vitals for the past 4 hrs:   BP Temp Temp src Pulse Resp SpO2   01/20/15 1415 118/73 mmHg - - 62 16 100 %   01/20/15 1400 120/59 mmHg - - 58 17 99 %   01/20/15 1345 123/73 mmHg 97.9 ??F (36.6 ??C) Temporal 54 18 98 %   01/20/15 1330 122/76 mmHg 97 ??F (36.1 ??C) Temporal 50 13 98 %   01/20/15 1315 118/75 mmHg - - 55 14 96 %   01/20/15 1300 137/75 mmHg - - 65 18 100 %   01/20/15 1248 141/66 mmHg 97 ??F (36.1 ??C) Temporal 87 15 100 %       Level of Consciousness:  Awake    Respiratory:  Stable    Oxygen Saturation:  Stable    Cardiovascular:  Stable    Hydration:  Adequate    PONV:  Stable    Post-op Pain:  Adequate analgesia    Post-op Assessment:  No apparent anesthetic complications    Additional Follow-Up / Treatment / Comment:  None    Jiles HaroldBijo J Nobuo Nunziata, MD  January 20, 2015   4:26 PM

## 2015-01-20 NOTE — H&P (Signed)
Department of Orthopedic Surgery  H&P Note          CHIEF COMPLAINT: Right humeral stump pain     HISTORY OF PRESENT ILLNESS:     The patient is a 29 y.o. male who presents with right humeral stump pain that has been present for several years. He last had an operation back 4-5 years ago in [place] where a revision of his stump was performed in attempt to decrease the pain over the site. He has been dealing with psychiatric issues recently and was at a [facility] facility for 1 1/2 weeks for treatment. He states that the stump is sensitive to touch and has prevented him from using his prosthesis.     Past Medical History:    Past Medical History          Diagnosis  Date    ???  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    ???  Seizures (HCC)          Past Surgical History:    Past Surgical History        Procedure  Laterality  Date    ???  Arm amputation        Rt above elbow age 21    ???  Arm amputation  Right          Current Medications:    Current Hospital Medications    No current facility-administered medications for this visit.     Allergies:  Tramadol    Social History:   TOBACCO:  reports that he quit smoking about 4 weeks ago. His smoking use included Cigarettes. He smoked 0.50 packs per day. He uses smokeless tobacco.  ETOH:  reports that he does not drink alcohol.  DRUGS:  reports that he does not use illicit drugs.  ACTIVITIES OF DAILY LIVING:   OCCUPATION:   Family History:    Family History    History reviewed. No pertinent family history.       REVIEW OF SYSTEMS:  CONSTITUTIONAL: negative for fevers and chills  HEENT: negative for headache  RESPIRATORY: negative for chest pain  CARDIOVASCULAR: negative for chest pain, dyspnea  GASTROINTESTINAL: negative for nausea and vomiting  MUSCULOSKELETAL: positive for pain  NEUROLOGICAL: positive for pain and tingling    PHYSICAL EXAM:   VITALS: BP 106/64 mmHg   Pulse 64   Temp(Src) 97.6 ??F (36.4 ??C) (Oral)   Resp 16   Ht   (1.88 m)   Wt 158 lb (71.668 kg)   BMI 20.28 kg/m2  CONSTITUTIONAL: awake, alert, cooperative, no apparent distress, and appears stated age  EYES: extra-ocular muscles intact  ENT: normocepalic, without obvious abnormality  NECK: supple, symmetrical, trachea midline  LUNGS: no increased work of breathing  CARDIOVASCULAR: normal apical pulses  ABDOMEN: soft  NEUROLOGIC: Cranial Nerves: cranial nerves II-XII are grossly intact  MUSCULOSKELETAL:  RUE   Humeral Diaphyseal mid to distal amputation with scar sites over anterior brachium   Tender to palpation with minimal soft tissue over distal bone stump  No ulceration or open wounds  Sensate stump circumferentially     DATA:   CBC:   Lab Results    Component  Value  Date     WBC  3.7  11/09/2014     RBC  3.81  11/09/2014     HGB  12.4  11/09/2014     HCT  36.7  11/09/2014  MCV  96.4  11/09/2014     MCH  32.4  11/09/2014     MCHC  33.6  11/09/2014     RDW  14.0  11/09/2014     PLT  156  11/09/2014     MPV  7.8  11/09/2014      PT/INR: No results found for: PROTIME, INR  Radiology Review: Right Humerus - mid to distal third humeral ampuation    IMPRESSION:  1. Right Humeral Amputation Stump Sensitivity     PLAN:  1. The patient has been dealing with both physical and emotional pain. We discussed the importance of inter-disciplinary treatment which from an orthopedic standpoint would include revision amputation   2. Patient was given a small amount of tylenol #3 prescription but ultimately will be referred to a pain management clinic      I have seen and evaluated the patient and agree with the above assessment and plan on today's visit. 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 Discussed possible revision amputation for sensitivity. Also further explained possible referral to pain management for continued ongoing stump pain.  Further discussed with him that revision amputation may or may not improve his ongoing symptoms. After discussion he wished proceed. I concur with the findings and plan as documented.      I explained the risks, benefits, alternatives and complications of surgery with the patient including but not limited to the risks of infection, possible damage to nerves, vessels, or tendons, stiffness, loss of range of motion, scar sensitivity, wound healing complications, worsening symptoms, possible need for therapy, as well as the possible need further surgery and unanticipated complications. The patient voiced understanding and all questions were answered. The patient elected to proceed with surgical intervention.   H & P Reviewed/Updated; No new changes to H & P.    Electronically signed by Elsie Raarrin J Norbert Malkin, PA-C on 01/20/2015 at 6:23 AM

## 2015-01-20 NOTE — Anesthesia Pre-Procedure Evaluation (Signed)
Department of Anesthesiology  Preprocedure Note       Name:  [name] [name]   Age:  29 y.o.  DOB:  05/31/1986                                          MRN:  [id number]         Date:  01/20/2015      Surgeon:  Charm Barges    Procedure:  Revision right humerus amputation    Medications prior to admission:   Prior to Admission medications    Medication Sig Start Date End Date Taking? Authorizing Provider   venlafaxine (EFFEXOR) 75 MG tablet Take 75 mg by mouth daily   Yes Historical Provider, MD   QUEtiapine (SEROQUEL) 100 MG tablet Take 100 mg by mouth daily   Yes Historical Provider, MD   hydrOXYzine (ATARAX) 50 MG tablet Take 100 mg by mouth 3 times daily Instructed to take am of procedure   Yes Historical Provider, MD   acetaminophen-codeine (TYLENOL/CODEINE #3) 300-30 MG per tablet Take 1 tablet by mouth every 4 hours as needed for Pain  Patient taking differently: Take 1 tablet by mouth every 4 hours as needed for Pain Instructed to take am of procedure if needed 01/11/15   Garnett Farm, MD   diclofenac (VOLTAREN) 75 MG EC tablet Take 1 tablet by mouth 2 times daily for 7 days 12/10/14 12/17/14  Casimer Leek, PA       Current medications:    Current Outpatient Prescriptions   Medication Sig Dispense Refill   ??? venlafaxine (EFFEXOR) 75 MG tablet Take 75 mg by mouth daily     ??? QUEtiapine (SEROQUEL) 100 MG tablet Take 100 mg by mouth daily     ??? hydrOXYzine (ATARAX) 50 MG tablet Take 100 mg by mouth 3 times daily Instructed to take am of procedure     ??? acetaminophen-codeine (TYLENOL/CODEINE #3) 300-30 MG per tablet Take 1 tablet by mouth every 4 hours as needed for Pain (Patient taking differently: Take 1 tablet by mouth every 4 hours as needed for Pain Instructed to take am of procedure if needed) 60 tablet 0   ??? diclofenac (VOLTAREN) 75 MG EC tablet Take 1 tablet by mouth 2 times daily for 7 days 14 tablet 0     Current Facility-Administered Medications   Medication Dose Route Frequency Provider Last Rate Last Dose   ???  0.9 % sodium chloride infusion   Intravenous Continuous Darrin J Muscarella, PA-C 125 mL/hr at 01/20/15 0953     ??? sodium chloride flush 0.9 % injection 10 mL  10 mL Intravenous PRN Darrin J Muscarella, PA-C       ??? ceFAZolin (ANCEF) 2 g in dextrose 3% 50mL IVPB (duplex)  2 g Intravenous On Call to OR Darrin J Muscarella, PA-C       ??? ceFAZolin sodium-dextrose (ANCEF) 2-3 GM-% IVPB (duplex) SOLR                Allergies:    Allergies   Allergen Reactions   ??? Tramadol Hives       Problem List:    Patient Active Problem List   Diagnosis Code   ??? Pain In Right Arm M79.601       Past Medical History:        Diagnosis Date   ??? PTSD (post-traumatic stress disorder)  from right arm amputation as a small child.   ??? Seizures (HCC)      last one 10/2014       Past Surgical History:        Procedure Laterality Date   ??? Arm amputation       Rt above elbow age 89       Social History:    History   Substance Use Topics   ??? Smoking status: Former Smoker -- 0.50 packs/day     Types: Cigarettes     Quit date: 12/13/2014   ??? Smokeless tobacco: Current User   ??? Alcohol Use: 3.6 oz/week     0 Not specified, 6 Cans of beer per week                                Ready to quit: Not Answered  Counseling given: Not Answered      Vital Signs (Current):   Filed Vitals:    01/15/15 1225 01/20/15 0930   BP:  125/57   Pulse:  61   Temp:  98.3 ??F (36.8 ??C)   TempSrc:  Temporal   Resp:  16   Height: 6\' 2"  (1.88 m) 6\' 2"  (1.88 m)   Weight: 160 lb (72.576 kg) 160 lb (72.576 kg)   SpO2:  100%                                              BP Readings from Last 3 Encounters:   01/20/15 125/57   01/11/15 106/64   12/10/14 128/66       NPO Status: Time of last liquid consumption: 0000Greater than 8 hours                        Time of last solid consumption: 0000                        Date of last liquid consumption: 01/20/15                        Date of last solid food consumption: 01/20/15    BMI:   Wt Readings from Last 3 Encounters:   01/20/15  160 lb (72.576 kg)   01/11/15 158 lb (71.668 kg)   12/10/14 135 lb (61.236 kg)     Body mass index is 20.53 kg/(m^2).    Anesthesia Evaluation  Patient summary reviewed and Nursing notes reviewed no history of anesthetic complications:   Airway:         Dental:          Pulmonary:   (+) COPD:      (-) sleep apnea    ROS comment: Smoker    Cardiovascular:negative ROS                   Neuro/Psych:   (+) seizures:, psychiatric history (PTSD (right arm amputation as child) ):   GI/Hepatic/Renal: neg ROS          Endo/Other: negative ROS         Abdominal:                    Anesthesia Plan    ASA 3  general     intravenous induction   Anesthetic plan and risks discussed with patient.    Plan discussed with CRNA.  Attending anesthesiologist reviewed and agrees with Pre Eval content          Oscar Hank Ihor Austin, MD   01/20/2015

## 2015-01-20 NOTE — Progress Notes (Signed)
Initial contact with pt. Report received from stage 1 nurse.Pt.a+ox3. Pt. Resp. Easy on room air skin w/d. Pt denies pain at this time. Waiting room called for pt family. Pt offered nourishment.

## 2015-01-20 NOTE — Progress Notes (Signed)
Pt given Discharge instructions. Pt and mother verbalized understanding. Pt without complaints vss. IV removed.  Instructed for follow up. Pt discharged to care of mother to car via wheelchair

## 2015-01-20 NOTE — Op Note (Signed)
[name] [name], [name] [name]                               [id number]      DATE OF PROCEDURE:  01/20/2015      SURGEON:  [name] [name], M.D.      ASSISTANT:  [name] J. Mucorales, PA-C.  He was present throughout the   procedure.  His assistance was used for preoperative positioning,   intraoperative retraction, closure, and dressing application.  His assistance   expedited the case to decrease surgical time.      PREOPERATIVE DIAGNOSIS:  Painful right distal humerus amputation site.      POSTOPERATIVE DIAGNOSIS:  Painful right distal humerus amputation site.      OPERATION:  Right distal humerus revision amputation with myodesis of the   brachialis muscle remnants.      ANESTHESIA:   1.    General   2     Local anesthetic by surgeon consisting of approximately 10 mL of 0.5%      Marcaine with epinephrine      ESTIMATED BLOOD LOSS:      COMPLICATIONS:      FINDINGS:   1.    A large bursitis surrounding the tip of the ulna which was excised   2.    Status post debridement and revision amputation using a saw.  Myodesis      of the muscle remnants was undertaken through bone holes through the      distal stump of the humerus which provided excellent pad over the tip of      the humerus.      SPECIMENS:  Excised bursal tissue was sent for culture.      DISPOSITION:  The patient stable throughout procedure.      INDICATIONS:  The patient is a 29 year old gentleman who, as a child,   sustained a mangled upper extremity, underwent amputation through the distal   aspect of the humerus.  Over the years, he has had multiple revision   surgeries for painful surgical site and overgrowth.  After discussion, he was   adamant that he wished to undergo revision of the amputation as he was   finding it quite bothersome and difficult to use the stump, which he uses   kind of as a post for working around the yard and house.  Risks, benefits,   alternatives, and complications were fully explained to him, including, but   not limited to the risks  of infection, damage to nerves, vessels, and   tendons, failure to relieve his symptoms, worsening symptoms, recurrence of   his symptoms, wound healing complications, as well as unforeseen   complications.  After discussion, he wished to proceed.      SURGERY IN DETAIL:  The patient was identified in the holding area.  Right   upper extremity was identified as the operative site.  He was then seen by   Anesthesia, taken to the operating room, placed supine on the table,   underwent general anesthesia per the anesthesia department.  He received   preoperative antibiotics.  Right upper extremity was prepared and draped in   standard sterile fashion.  Tourniquet was not used.      Local anesthetic was infiltrated over the surgical site.  After the   epinephrine was allowed to take effect, the previous scar straight down the   long axis of the stump was undertaken.  Immediately encountered was a large   amount of seroma and bursa tissue over the tip of the amputation site.  This   was sharply excised with a 15 blade.  Flaps were elevated medially and   laterally to expose the remaining muscle unit, as well as expose the distal   aspect of the humerus.  The humeral stump was then mobilized.  An oscillating   saw was then used to remove approximately 3-4 mm to expose healthy cancellous   bone followed by drill holes in the lateral cortex.  Three drill holes were   inserted.  A #1 Vicryl suture was then used to mobilize the medial muscle   unit over the stump to provide adequate coverage.  Prior to doing this, a   large amount of copious irrigation was undertaken.  Again, the excised bursal   tissue was sent for culture.      After this was done, the myodesis for the medial _____ of the distal stump of   the humerus was undertaken as previously described.  The wound was again   copiously irrigated out.  Deep tissues were then reapproximated using 2-0   Vicryl followed by 3-0 nylon suture and a bulky, soft, sterile  dressing.      Dictated by:  [name] [name], M.D.         AB / nmt/de   DD: 01/20/2015   05:14 P    DT: 01/20/2015 08:54 P   [id number]    [id number]   CC:   [name] [name], M.D.         [name] PeanICHARD A. [name], M.D.

## 2015-01-20 NOTE — Brief Op Note (Signed)
Brief Postoperative Note    J[place]nee [place]  Date of Birth:  1986/08/05  [id number]    Pre-operative Diagnosis: R humeral amputation stump sensitivity     Post-operative Diagnosis: Same    Procedure: R humeral amputation revision.     Anesthesia: General    Surgeons/Assistants: Koleen NimrodAdrian L. Charm BargesButler, M.D,  Marialuisa Basara J. Lurleen Soltero, PA-C     Estimated Blood Loss: less than 50     Complications: None    Specimens: Was Obtained: right amputation humeral tissue    Findings: See Op Note.      Electronically signed by Elsie Raarrin J Etter Royall, PA-C on 01/20/2015 at 12:26 PM

## 2015-01-21 LAB — GRAM STAIN

## 2015-01-22 LAB — CULTURE, SURGICAL

## 2015-01-25 LAB — CULTURE, ANAEROBIC

## 2015-01-26 NOTE — Telephone Encounter (Signed)
Pt called to schedule a post-op with Dr Charm BargesButler. I left a msg - could not reach anyone at main line or backline. Please call pt back to schedule his post - op visit

## 2015-02-02 ENCOUNTER — Ambulatory Visit: Admit: 2015-02-02 | Discharge: 2015-02-02 | Payer: PRIVATE HEALTH INSURANCE | Attending: Orthopaedic Surgery

## 2015-02-02 DIAGNOSIS — M79601 Pain in right arm: Secondary | ICD-10-CM

## 2015-02-02 MED ORDER — HYDROCODONE-ACETAMINOPHEN 5-325 MG PO TABS
5-325 MG | ORAL_TABLET | Freq: Four times a day (QID) | ORAL | Status: DC | PRN
Start: 2015-02-02 — End: 2015-03-22

## 2015-02-02 NOTE — Progress Notes (Signed)
Returns today two weeks status post revision amputation right humerus. He is somewhat upset that more bone wasn't taken. Reports his pain as a same as it was prior surgery.    Physical exam: Right amputation site with sutures in place. No signs of infection. Sutures removed. Palpable muscular coverage over the tip of the amputation site. No signs of infection.    Cultures: Intraoperative cultures were reviewed were negative for growth. Negative for Gram stain.    Assessment: Right revision humerus amputation with muscular coverage    Plan    Did advise the patient that his muscle was sewn over the tip of the revised amputation site through suture holes and good coverage was achieved. Again he is upset that more bone wasn't taken. I did advise him that even if more bone was taken that this may not fully alleviate his ongoing symptoms. Reports he does not use the arm for any type of activity. He further added he wishes to be referred to a pain clinic. He asked for a renewal on his pain medications. He declined any further treatment including therapy.  Patient was advised that he can contact the office at any time for further evaluation and ongoing treatment recommendations.

## 2015-02-17 LAB — CULTURE, FUNGUS
Fungus (Mycology) Culture: 4
Fungus (Mycology) Culture: 4

## 2015-03-12 MED ORDER — ACETAMINOPHEN-CODEINE #3 300-30 MG PO TABS
300-30 MG | ORAL_TABLET | ORAL | Status: DC | PRN
Start: 2015-03-12 — End: 2015-03-22

## 2015-03-17 LAB — CULTURE WITH SMEAR, ACID FAST BACILLIUS: AFB Culture (Mycobacteria): 8

## 2015-03-22 ENCOUNTER — Ambulatory Visit: Admit: 2015-03-22 | Discharge: 2015-03-22 | Payer: PRIVATE HEALTH INSURANCE | Attending: Orthopaedic Surgery

## 2015-03-22 DIAGNOSIS — S62327A Displaced fracture of shaft of fifth metacarpal bone, left hand, initial encounter for closed fracture: Secondary | ICD-10-CM

## 2015-03-22 MED ORDER — HYDROCODONE-ACETAMINOPHEN 5-325 MG PO TABS
5-325 MG | ORAL_TABLET | Freq: Four times a day (QID) | ORAL | Status: DC | PRN
Start: 2015-03-22 — End: 2015-03-24
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
## 2015-03-23 NOTE — Progress Notes (Signed)
I have attempted without success to contact this patient by phone for Preadmission Testing phone assessment.   Message left for pt to return call.

## 2015-03-23 NOTE — Patient Instructions (Signed)
[facility] PRE-ADMISSION TESTING INSTRUCTIONS    The Preadmission Testing patient is instructed accordingly using the following criteria (check applicable):    ARRIVAL INSTRUCTIONS:  [x] Parking the day of Surgery is located in the Main Entrance lot.  Upon entering the door, make an immediate right to the surgery reception desk    [x] Bring photo ID and insurance card    [] Bring in a copy of Living will or Durable Power of attorney papers.    [x] Please be sure to arrange transportation to and from the hospital    [x] Please arrange for someone to be with you the remainder of the day due to having anesthesia      GENERAL INSTRUCTIONS:    [x] Nothing by mouth after midnight, including gum, candy, mints or water    [x] You may brush your teeth, but do not swallow any water    [x] Take medications as instructed with 1-2 oz of water    [] Stop herbal supplements and vitamins 5 days prior to procedure    [] Follow preop dosing of blood thinners per physician instructions    [] Do not take insulin or oral diabetic medications    [] If diabetic and have low blood sugar or feel symptomatic, take 1-2oz apple juice or glucose tablets    [] Bring inhalers day of surgery    [] Bring C-PAP/ Bi-Pap day of surgery    [] Bring urine specimen day of surgery    [x] Antibacterial Soap shower or bath AM of Surgery, no lotion, powders or creams to surgical site    [] Follow bowel prep as instructed per surgeon    [x] No smoking within 24 hours of surgery     [x] No alcohol or illegal drug use within 24 hours of surgery.    [x] Jewelry, body piercing's, eyeglasses, contact lenses and dentures are not permitted into surgery (bring cases)      [] Please do not wear any nail polish or make up on the day of surgery    [] If not already done, you can expect a call from registration    [x] If surgeon requests a time change you will be notified the day prior to surgery    [x] If you receive a survey after surgery we  would greatly appreciate your comments    [] Parent/guardian of a minor must accompany their child and remain on the premises  the entire time they are under our care     [] Pediatric patients may bring favorite toy, blanket or comfort item with them    [] A caregiver or family member must remain with the patient during their stay if they are mentally handicapped, have dementia, disoriented or unable to use a call light or would be a safety concern if left unattended    [x] Please notify surgeon if you develop any illness between now and time of surgery (cold, cough, sore throat, fever, nausea, vomiting) or any signs of infections  including skin, wounds, and dental.    [] Other instructions    EDUCATIONAL MATERIALS PROVIDED:    [] PAT Preoperative Education Packet/Booklet     [] Medication List    [] Fluoroscopy Information Pamphlet    [] Transfusion bracelet applied with instructions    [] Joint replacement video reviewed    [] Shower with antibacterial soap and use CHG wipes provided the evening before surgery as instructed

## 2015-03-23 NOTE — Patient Instructions (Signed)
[facility] PRE-ADMISSION TESTING INSTRUCTIONS    The Preadmission Testing patient is instructed accordingly using the following criteria (check applicable):    ARRIVAL INSTRUCTIONS:  [x] Parking the day of Surgery is located in the Main Entrance lot.  Upon entering the door, make an immediate right to the surgery reception desk    [x] Bring photo ID and insurance card    [] Bring in a copy of Living will or Durable Power of attorney papers.    [x] Please be sure to arrange transportation to and from the hospital    [x] Please arrange for someone to be with you the remainder of the day due to having anesthesia      GENERAL INSTRUCTIONS:    [x] Nothing by mouth after midnight, including gum, candy, mints or water    [x] You may brush your teeth, but do not swallow any water    [x] Take medications as instructed with 1-2 oz of water    [] Stop herbal supplements and vitamins 5 days prior to procedure    [] Follow preop dosing of blood thinners per physician instructions    [] Do not take insulin or oral diabetic medications    [] If diabetic and have low blood sugar or feel symptomatic, take 1-2oz apple juice or glucose tablets    [] Bring inhalers day of surgery    [] Bring C-PAP/ Bi-Pap day of surgery    [] Bring urine specimen day of surgery    [x] Antibacterial Soap shower or bath AM of Surgery, no lotion, powders or creams to surgical site    [] Follow bowel prep as instructed per surgeon    [x] No smoking within 24 hours of surgery     [x] No alcohol or illegal drug use within 24 hours of surgery.    [x] Jewelry, body piercing's, eyeglasses, contact lenses and dentures are not permitted into surgery (bring cases)      [] Please do not wear any nail polish or make up on the day of surgery    [x] If not already done, you can expect a call from registration    [x] If surgeon requests a time change you will be notified the day prior to surgery    [x] If you receive a survey after surgery we  would greatly appreciate your comments    [] Parent/guardian of a minor must accompany their child and remain on the premises  the entire time they are under our care     [] Pediatric patients may bring favorite toy, blanket or comfort item with them    [] A caregiver or family member must remain with the patient during their stay if they are mentally handicapped, have dementia, disoriented or unable to use a call light or would be a safety concern if left unattended    [x] Please notify surgeon if you develop any illness between now and time of surgery (cold, cough, sore throat, fever, nausea, vomiting) or any signs of infections  including skin, wounds, and dental.    [] Other instructions    EDUCATIONAL MATERIALS PROVIDED:    [] PAT Preoperative Education Packet/Booklet     [] Medication List    [] Fluoroscopy Information Pamphlet    [] Transfusion bracelet applied with instructions    [] Joint replacement video reviewed    [] Shower with antibacterial soap and use CHG wipes provided the evening before surgery as instructed

## 2015-03-23 NOTE — Progress Notes (Signed)
I have attempted without success to contact this patient by phone for Preadmission Testing phone assessment.  Arrival instructions provided on voice mail.

## 2015-03-24 ENCOUNTER — Encounter

## 2015-03-24 ENCOUNTER — Inpatient Hospital Stay: Admit: 2015-03-24 | Discharge: 2015-03-31 | Payer: PRIVATE HEALTH INSURANCE | Attending: Orthopaedic Surgery

## 2015-03-24 ENCOUNTER — Ambulatory Visit: Admit: 2015-03-24 | Payer: PRIVATE HEALTH INSURANCE

## 2015-03-24 DIAGNOSIS — R52 Pain, unspecified: Secondary | ICD-10-CM

## 2015-03-24 MED ORDER — FENTANYL CITRATE (PF) 100 MCG/2ML IJ SOLN
100 MCG/2ML | INTRAMUSCULAR | Status: DC | PRN
Start: 2015-03-24 — End: 2015-03-25

## 2015-03-24 MED ORDER — MIDAZOLAM HCL 2 MG/2ML IJ SOLN
2 MG/ML | Freq: Once | INTRAMUSCULAR | Status: AC
Start: 2015-03-24 — End: 2015-03-24
  Administered 2015-03-24: 16:00:00 1 mg via INTRAVENOUS

## 2015-03-24 MED ORDER — HYDROMORPHONE HCL 1 MG/ML IJ SOLN
1 MG/ML | INTRAMUSCULAR | Status: DC | PRN
Start: 2015-03-24 — End: 2015-03-25

## 2015-03-24 MED ORDER — NORMAL SALINE FLUSH 0.9 % IV SOLN
0.9 % | INTRAVENOUS | Status: DC | PRN
Start: 2015-03-24 — End: 2015-03-25

## 2015-03-24 MED ORDER — LIDOCAINE HCL 1 % IJ SOLN
1 % | INTRAMUSCULAR | Status: AC
Start: 2015-03-24 — End: ?

## 2015-03-24 MED ORDER — BUPIVACAINE HCL (PF) 0.5 % IJ SOLN
0.5 % | INTRAMUSCULAR | Status: AC
Start: 2015-03-24 — End: ?

## 2015-03-24 MED ORDER — SODIUM CHLORIDE 0.9 % IV SOLN
0.9 % | INTRAVENOUS | Status: DC
Start: 2015-03-24 — End: 2015-03-25
  Administered 2015-03-24: 16:00:00 via INTRAVENOUS

## 2015-03-24 MED ORDER — CEFAZOLIN SODIUM-DEXTROSE 2-3 GM-% IV SOLR
2-3 GM-% | INTRAVENOUS | Status: AC
Start: 2015-03-24 — End: 2015-03-24
  Administered 2015-03-24: 19:00:00 2 g via INTRAVENOUS

## 2015-03-24 MED ORDER — DIPHENHYDRAMINE HCL 50 MG/ML IJ SOLN
50 MG/ML | Freq: Once | INTRAMUSCULAR | Status: AC | PRN
Start: 2015-03-24 — End: 2015-03-24

## 2015-03-24 MED ORDER — MEPERIDINE HCL 25 MG/ML IJ SOLN
25 MG/ML | INTRAMUSCULAR | Status: DC | PRN
Start: 2015-03-24 — End: 2015-03-25

## 2015-03-24 MED ORDER — OXYCODONE-ACETAMINOPHEN 5-325 MG PO TABS
5-325 MG | ORAL_TABLET | Freq: Four times a day (QID) | ORAL | Status: DC | PRN
Start: 2015-03-24 — End: 2018-03-12

## 2015-03-24 MED ORDER — PROMETHAZINE HCL 25 MG/ML IJ SOLN
25 MG/ML | INTRAMUSCULAR | Status: DC | PRN
Start: 2015-03-24 — End: 2015-03-25

## 2015-03-24 MED FILL — MIDAZOLAM HCL 2 MG/2ML IJ SOLN: 2 MG/ML | INTRAMUSCULAR | Qty: 2

## 2015-03-24 MED FILL — BUPIVACAINE HCL (PF) 0.5 % IJ SOLN: 0.5 % | INTRAMUSCULAR | Qty: 20

## 2015-03-24 MED FILL — FENTANYL CITRATE (PF) 250 MCG/5ML IJ SOLN: 250 MCG/5ML | INTRAMUSCULAR | Qty: 2

## 2015-03-24 MED FILL — CEFAZOLIN SODIUM-DEXTROSE 2-3 GM-% IV SOLR: 2-3 GM-% | INTRAVENOUS | Qty: 2

## 2015-03-24 MED FILL — LIDOCAINE HCL 1 % IJ SOLN: 1 % | INTRAMUSCULAR | Qty: 20

## 2015-03-24 NOTE — Anesthesia Pre-Procedure Evaluation (Signed)
Department of Anesthesiology  Preprocedure Note       Name:  [name] [name]   Age:  29 y.o.  DOB:  04/27/1986                                          MRN:  [id number]         Date:  03/24/2015      Surgeon:  Charm BargesButler    Procedure:  Left 5th metacarpal open reduction internal fixation    Medications prior to admission:   Prior to Admission medications    Medication Sig Start Date End Date Taking? Authorizing Provider   HYDROcodone-acetaminophen (NORCO) 5-325 MG per tablet Take 1 tablet by mouth every 6 hours as needed for Pain  Patient taking differently: Take 1 tablet by mouth every 6 hours as needed for Pain Instructed to take with sip water am of procedure, if needed. 03/22/15  Yes Garnett FarmAdrian L Butler, MD   diclofenac (VOLTAREN) 75 MG EC tablet Take 1 tablet by mouth 2 times daily for 7 days 12/10/14 12/17/14  Casimer LeekMelissa M Haire, PA       Current medications:    Current Outpatient Prescriptions   Medication Sig Dispense Refill   ??? HYDROcodone-acetaminophen (NORCO) 5-325 MG per tablet Take 1 tablet by mouth every 6 hours as needed for Pain (Patient taking differently: Take 1 tablet by mouth every 6 hours as needed for Pain Instructed to take with sip water am of procedure, if needed.) 60 tablet 0   ??? diclofenac (VOLTAREN) 75 MG EC tablet Take 1 tablet by mouth 2 times daily for 7 days 14 tablet 0     Current Facility-Administered Medications   Medication Dose Route Frequency Provider Last Rate Last Dose   ??? 0.9 % sodium chloride infusion   Intravenous Continuous Darrin J Muscarella, PA-C       ??? sodium chloride flush 0.9 % injection 10 mL  10 mL Intravenous PRN Darrin J Muscarella, PA-C       ??? ceFAZolin (ANCEF) 2 g in dextrose 3% 50mL IVPB (duplex)  2 g Intravenous See Admin Instructions Darrin J Muscarella, PA-C           Allergies:    Allergies   Allergen Reactions   ??? Tramadol Hives       Problem List:    Patient Active Problem List   Diagnosis Code   ??? Pain In Right Arm M79.601       Past Medical History:        Diagnosis  Date   ??? 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   ??? Seizures (HCC)      last one 10/2014   ??? Pain      right arm, amputation site       Past Surgical History:        Procedure Laterality Date   ??? Arm amputation       Rt above elbow age 89   ??? Other surgical history Right 01/20/2015     revision humerus amputation       Social History:    History   Substance Use Topics   ??? Smoking status: Current Every Day Smoker -- 0.50 packs/day     Types: Cigarettes     Last Attempt to Quit: 12/13/2014   ??? Smokeless tobacco:  Current User   ??? Alcohol Use: 3.6 oz/week     6 Cans of beer, 0 Not specified per week                                Ready to quit: Not Answered  Counseling given: Not Answered      Vital Signs (Current):   Filed Vitals:    03/23/15 1425   Height:  (1.88 m)   Weight: 165 lb (74.844 kg)                                              BP Readings from Last 3 Encounters:   03/22/15 129/82   02/02/15 96/53   01/20/15 118/73       NPO Status:                                                                                 BMI:   Wt Readings from Last 3 Encounters:   03/23/15 165 lb (74.844 kg)   03/22/15 167 lb 12.8 oz (76.114 kg)   02/02/15 145 lb (65.772 kg)     Body mass index is 21.18 kg/(m^2).    Anesthesia Evaluation  Patient summary reviewed and Nursing notes reviewed no history of anesthetic complications:   Airway: Mallampati: II       Mouth opening: > = 3 FB Dental:          Pulmonary: breath sounds clear to auscultation      ROS comment: smoker   Cardiovascular:negative ROS            Rhythm: regular  Rate: normal                 Neuro/Psych:   (+) seizures (unclear if noncompliant or not on meds anymore):, psychiatric history (PTSD):   GI/Hepatic/Renal: neg ROS          Endo/Other: negative ROS         Abdominal:       Abdomen: soft.      Other findings: Patient very irritated in general.  He is occupied with his phone and requires multiple attempts to get  a response.       Anesthesia Plan    ASA 2     general     intravenous induction   Anesthetic plan and risks discussed with patient.    Plan discussed with CRNA.      Spoke to Careers adviser and he stated it was okay to place IV in same arm as operative site.  His contralateral arm has been amputated around mid humerus level.        Jiles Harold, MD   03/24/2015

## 2015-03-24 NOTE — Progress Notes (Signed)
1640 taking oral fluids well  1645 dr Alveria Apleymuscarella here to adjust splint for pt  1700 discharge instructions reviewed;verbalizes understanding  1710 out via wheelchair to awaiting ride

## 2015-03-24 NOTE — H&P (Signed)
Department of Orthopedic Surgery      CHIEF COMPLAINT: Left 5th metacarpal fracture    HISTORY OF PRESENT ILLNESS:     The patient is a 29 y.o. male who presents with left 5th metacarpal fracture. Reports is having argument when he punched the floor. He sustained immediate pain and discomfort in his left hand. 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 Reports significant dysfunction given his fracture. Patient reports he d desires surgery to provide stability to his left hand..    Past Medical History:    Past Medical History          Diagnosis  Date    ???  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    ???  Seizures (HCC)       last one 10/2014         Past Surgical History:    Past Surgical History        Procedure  Laterality  Date    ???  Arm amputation        Rt above elbow age 26    ???  Other surgical history  Right  01/20/2015      revision humerus amputation         Current Medications:    Current Hospital Medications    No current facility-administered medications for this visit.     Allergies:  Tramadol    Social History:   TOBACCO:  reports that he quit smoking about 3 months ago. His smoking use included Cigarettes. He smoked 0.50 packs per day. He uses smokeless tobacco.  ETOH:  reports that he drinks about 3.6 oz of alcohol per week.  DRUGS:  reports that he does not use illicit drugs.  ACTIVITIES OF DAILY LIVING:   OCCUPATION:   Family History:    Family History    History reviewed. No pertinent family history.       REVIEW OF SYSTEMS:  CONSTITUTIONAL: negative  EYES: negative  HEENT: negative  RESPIRATORY: negative  CARDIOVASCULAR: negative  GASTROINTESTINAL: negative  GENITOURINARY: negative  INTEGUMENT/BREAST: negative  HEMATOLOGIC/LYMPHATIC: negative  ALLERGIC/IMMUNOLOGIC: negative  ENDOCRINE: negative  MUSCULOSKELETAL: Multiple surgeries right upper extremity status post traumatic amputation  as a child  NEUROLOGICAL: negative  BEHAVIOR/PSYCH: negative    PHYSICAL EXAM:   VITALS: BP 129/82 mmHg   Pulse 71   Temp(Src) 97.3 ??F (36.3 ??C) (Oral)   Wt 167 lb 12.8 oz (76.114 kg)  CONSTITUTIONAL: awake, alert, cooperative, no apparent distress, and appears stated age  EYES: Lids and lashes normal, pupils equal, round and reactive to light, extra ocular muscles intact, sclera clear, conjunctiva normal  ENT: Normocephalic, without obvious abnormality, atraumatic, sinuses nontender on palpation, external ears without lesions, oral pharynx with moist mucus membranes, tonsils without erythema or exudates, gums normal and good dentition.  NECK: Supple, symmetrical, trachea midline, no adenopathy, thyroid symmetric, not enlarged and no tenderness, skin normal  LUNGS: No increased work of breathing, good air exchange, clear to auscultation bilaterally, no crackles or wheezing  CARDIOVASCULAR: Normal apical impulse, regular rate and rhythm, normal S1 and S2, no S3 or S4, and no murmur noted  ABDOMEN: No scars, normal bowel sounds, soft, non-distended, non-tender, no masses palpated, no hepatosplenomegally  CHEST/BREASTS: deferred  GENITAL/URINARY: deferred  NEUROLOGIC: Awake, alert, oriented to name, place and time. Cranial nerves II-XII are grossly intact. Motor is 5 out  of 5 bilaterally. Sensory is intact. gait is normal.  MUSCULOSKELETAL:  ?? Left upper extremity: Nontender shoulder elbow and wrist region. Positive swelling and tenderness over the 5th metacarpal. Nontender over the 4th metacarpal. Loss of normal contour to the 5th MCP joint. Limited range of motion of hand secondary discomfort. Radial, ulnar, median nerve sensation intact light touch. Gross motor testing 5/5. 2+ radial pulse with brisk at refill fingers. Skin is intact.    DATA:   CBC:   Lab Results    Component  Value  Date     WBC  3.7  11/09/2014     RBC  3.81  11/09/2014     HGB  12.4  11/09/2014     HCT  36.7  11/09/2014     MCV  96.4  11/09/2014      MCH  32.4  11/09/2014     MCHC  33.6  11/09/2014     RDW  14.0  11/09/2014     PLT  156  11/09/2014     MPV  7.8  11/09/2014      PT/INR: No results found for: PROTIME, INR  Radiology Review: Hand x-rays demonstrate a palmarly displaced comminuted 5th metacarpal distal diaphyseal fracture    IMPRESSION:  ?? Left closed comminuted displaced 5th metacarpal fracture    PLAN:  I've explained today's findings with the patient. I recommended surgical stabilization. In addition to discuss nonsurgical management. He reports significant dysfunction of the hand given that he only has one hand. He like to proceed with surgical stabilization.     I explained the risks, benefits, alternatives and complications of surgery with the patient including but not limited to the risks of death, possible damage to nerves, vessels, or tendons, possible infection, possible nonunion, possible malunion, possible hardware failure, possible need for hardware removal, stiffness, as well as the possible need further surgery and unanticipated complications. The patient voiced understanding and all questions were answered. The patient elected to proceed with surgical intervention.   H & P Reviewed/Updated; No new changes to H & P.    Electronically signed by Elsie Raarrin J Chemika Nightengale, PA-C on 03/24/2015 at 6:38 AM

## 2015-03-24 NOTE — Brief Op Note (Signed)
Brief Postoperative Note    [name] [name]  Date of Birth:  Jul 01, 1986  [id number]    Pre-operative Diagnosis: L closed comminuted displaced 5th metacarpal fx    Post-operative Diagnosis: Same    Procedure: L closed percutaneous pinning of 5th metacarpal    Anesthesia: General    Surgeons/Assistants: Butler/Johnson    Estimated Blood Loss: less than 50     Complications: None    Specimens: Was Not Obtained    Findings: See Op Note.      Electronically signed by [name] Raarrin J Yoav Okane, PA-C on 03/24/2015 at 3:07 PM

## 2015-03-24 NOTE — Op Note (Signed)
[name], [name]                               [id number]      DATE OF PROCEDURE:  03/24/2015      SURGEON:  [name] FarmADRIAN L. Latrise [name], M.D.      ASSISTANT:      PREOPERATIVE DIAGNOSIS:  Left 5th metacarpal fracture.      POSTOPERATIVE DIAGNOSIS:  Left 5th metacarpal fracture.      OPERATION:  Left 5th metacarpal fracture reduction and screw fixation using a   Synthes 3.0, 40 mm headless compression screw.      ANESTHESIA:   1.    General   2.    Local anesthetic by surgeon consisting of approximately 10 mL 0.5%      Marcaine plain      ESTIMATED BLOOD LOSS:      COMPLICATIONS:      FINDINGS:   1.    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    Intraoperative fluoroscopy confirmed a diaphyseal fracture to the small      finger metacarpal that was reduced into good alignment in both the AP and      lateral planes followed by fixation with a single retrograde      intramedullary Synthes 3.0 headless compression screw measuring 40 mm in      length, which his the longest screw they had available   3.    Status post fixation, appropriate cadence and alignment to the small      finger was achieved with full range of motion and without significant      crossover deformity or abduction deformity      DISPOSITION:  The patient remained stable throughout procedure.      INDICATIONS:  The patient is a very pleasant 29 year old gentleman,   previously was seen for right upper extremity amputation, presented to the   office after punching the ground and sustained a deformity to his left hand,   which is his only hand.  After extensive discussion in the office, he wished   to proceed with surgical stabilization to improve overall function and   alignment and restore function sooner to his hand.  He understands that open   reduction and internal fixation with plates and screws may be required versus   retrograde fixation with an intramedullary screw.  In addition, the risks   included  but were not limited to infection, damage to the nerves, vessels,   tendons; malunion; nonunion; residual crossover deformity; possible   symptomatic hardware; possible need for hardware removal.  He understood and   wished to proceed.      PROCEDURE:  The patient was identified in the holding area.  The left hand   was identified as the operative site.  He was then seen by Anesthesia, taken   to the operating room, placed supine on the table, underwent general   anesthesia per Anesthesia, and a well-padded arm tourniquet was placed.  The   left upper extremity was prepared and draped in standard sterile fashion.   The arm was exsanguinated, tourniquet inflated to 250 mmHg; total tourniquet   time was approximately 16 minutes.      Fluoroscopy was then brought in.  AP and lateral views confirmed the fracture   pattern.  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   Reduction maneuvers using 3-point reduction were then undertaken to restore   the palmar flexion into neutral.  Longitudinal retraction was performed to   restore length.  With this confirmed, a guidepin was placed through the   metacarpal head under fluoroscopic imaging.  Good alignment was achieved.   Good reduction was achieved.  The screw measured greater than 40 mm; however,   40 mm was the longest screw available.  This was then drilled and the screw   inserted through an incision over the MCP joint.  The MCP joint incision was   undertaken after the pin was placed, blunt dissection carried down to the   head.  This was then followed by drilling and insertion of a screw.  The   screw was placed subchondral, confirmed by the AP and lateral planes.  Good   fixation was achieved with a screw.  Overall alignment was restored in the   lateral view of the hand, restoring the flexion deformity.  AP confirmed   appropriate alignment relative to the previous malunion in the ring finger.   Cadence was assessed meticulously  intraoperatively, to ensure proper   alignment relative to the adjacent ring finger.  Wound was copiously   irrigated out.  Skin closed with nylon suture.  A digital block placed with   Marcaine  followed by sterile dressing and protective splint.         Dictated by:  [name] Patricia. Charm [name], M.D.            AB / nmt-th   DD: 03/24/2015   07:07 P    DT: 03/25/2015 07:16 A   [id number]    [id number]   CC:   [name] [name], M.D.

## 2015-03-25 MED FILL — LIDOCAINE HCL (CARDIAC) 20 MG/ML IV SOLN: 20 MG/ML | INTRAVENOUS | Qty: 5

## 2015-03-25 MED FILL — ONDANSETRON HCL 4 MG/2ML IJ SOLN: 4 MG/2ML | INTRAMUSCULAR | Qty: 2

## 2015-03-25 MED FILL — DIPRIVAN 200 MG/20ML IV EMUL: 200 MG/20ML | INTRAVENOUS | Qty: 20

## 2015-03-25 MED FILL — DEXAMETHASONE SODIUM PHOSPHATE 4 MG/ML IJ SOLN: 4 MG/ML | INTRAMUSCULAR | Qty: 1

## 2015-03-25 MED FILL — SOJOURN IN SOLN: RESPIRATORY_TRACT | Qty: 250

## 2015-03-25 NOTE — Anesthesia Post-Procedure Evaluation (Signed)
Department of Anesthesiology  Post-Anesthesia Note    Name:  [name] [name]                                         Age:  29 y.o.  MRN:  [id number]     Last Vitals:  BP 152/67 mmHg   Pulse 54   Temp(Src) 97.6 ??F (36.4 ??C) (Temporal)   Resp 17   Ht 6\' 2"  (1.88 m)   Wt 165 lb (74.844 kg)   BMI 21.18 kg/m2   SpO2 100%  No data found.      Level of Consciousness:  Awake    Respiratory:  Stable    Oxygen Saturation:  Stable    Cardiovascular:  Stable    Hydration:  Adequate    PONV:  Stable    Post-op Pain:  Adequate analgesia    Post-op Assessment:  No apparent anesthetic complications    Additional Follow-Up / Treatment / Comment:  None    Jiles HaroldBijo J [name], MD  Mar 25, 2015   3:56 AM

## 2017-05-16 ENCOUNTER — Inpatient Hospital Stay: Admit: 2017-05-16 | Discharge: 2017-05-16 | Disposition: A | Discharge status: Home / Self Care | Payer: Medicaid Other

## 2017-05-16 ENCOUNTER — Emergency Department: Admit: 2017-05-16 | Payer: Medicaid Other

## 2017-05-16 DIAGNOSIS — R21 Rash and other nonspecific skin eruption: Secondary | ICD-10-CM

## 2017-05-16 MED ORDER — DIPHENHYDRAMINE HCL 25 MG PO TABS
25 MG | Freq: Once | ORAL | Status: AC
Start: 2017-05-16 — End: 2017-05-16
  Administered 2017-05-16: 18:00:00 25 mg via ORAL

## 2017-05-16 MED ORDER — DIPHENHYDRAMINE HCL 25 MG PO CAPS
25 MG | ORAL_CAPSULE | Freq: Four times a day (QID) | ORAL | 0 refills | Status: AC | PRN
Start: 2017-05-16 — End: 2017-05-26

## 2017-05-16 MED ORDER — FAMOTIDINE 20 MG PO TABS
20 MG | Freq: Once | ORAL | Status: AC
Start: 2017-05-16 — End: 2017-05-16
  Administered 2017-05-16: 18:00:00 20 mg via ORAL

## 2017-05-16 MED ORDER — DEXAMETHASONE SODIUM PHOSPHATE 10 MG/ML IJ SOLN
10 MG/ML | Freq: Once | INTRAMUSCULAR | Status: AC
Start: 2017-05-16 — End: 2017-05-16
  Administered 2017-05-16: 18:00:00 10 mg via INTRAMUSCULAR

## 2017-05-16 MED ORDER — METHYLPREDNISOLONE 4 MG PO TBPK
4 MG | ORAL_TABLET | ORAL | Status: AC
Start: 2017-05-16 — End: 2017-05-22

## 2017-05-16 MED ORDER — FAMOTIDINE 20 MG PO TABS
20 MG | ORAL_TABLET | Freq: Two times a day (BID) | ORAL | 0 refills | Status: DC
Start: 2017-05-16 — End: 2018-03-12

## 2017-05-16 MED ORDER — OXYCODONE-ACETAMINOPHEN 5-325 MG PO TABS
5-325 MG | ORAL_TABLET | Freq: Four times a day (QID) | ORAL | 0 refills | Status: AC | PRN
Start: 2017-05-16 — End: 2017-05-19

## 2017-05-16 MED ORDER — OXYCODONE-ACETAMINOPHEN 5-325 MG PO TABS
5-325 MG | Freq: Once | ORAL | Status: AC
Start: 2017-05-16 — End: 2017-05-16
  Administered 2017-05-16: 18:00:00 1 via ORAL

## 2017-05-16 MED FILL — DEXAMETHASONE SODIUM PHOSPHATE 10 MG/ML IJ SOLN: 10 MG/ML | INTRAMUSCULAR | Qty: 1

## 2017-05-16 MED FILL — BANOPHEN 25 MG PO TABS: 25 MG | ORAL | Qty: 1

## 2017-05-16 MED FILL — FAMOTIDINE 20 MG PO TABS: 20 MG | ORAL | Qty: 1

## 2017-05-16 MED FILL — OXYCODONE-ACETAMINOPHEN 5-325 MG PO TABS: 5-325 MG | ORAL | Qty: 1

## 2017-05-16 NOTE — ED Provider Notes (Signed)
[facility]    Department of Emergency Medicine   ED  Provider Note  Admit Date/RoomTime: 05/16/2017 12:46 PM  ED Room: 33/33  Chief Complaint   Rash (x2 days) and Arm Pain (right arm amputation "2-3 years ago" - pain since with increasing pain recently)    History of Present Illness   Source of history provided by:  patient.  History/Exam Limitations: none.      [name] [name] is a 31 y.o. old male with has a past medical history of:   Past Medical History:   Diagnosis Date   . Pain     right arm, amputation site   . 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   . Seizures (HCC)     last one 10/2014    presents to the emergency department by private vehicle, for complaint of sudden onset red, raised and itchy area on  Right arm which began 2 day(s) prior to arrival.  The symptoms were caused by unknown cause.  Since onset the symptoms have been persistent.   Prior history of similar episodes: Yes.   His symptoms are associated with rash and relieved by nothing.  He denies any difficulty breathing, difficulty swallowing, wheezing or throat tightness.    ROS    Pertinent positives and negatives are stated within HPI, all other systems reviewed and are negative.    Past Surgical History:   Procedure Laterality Date   . ARM AMPUTATION      Rt above elbow age 89   . FINGER FRACTURE SURGERY Left 03/24/2015    5th metacarpal open reduction internal fixation   . OTHER SURGICAL HISTORY Right 01/20/2015    revision humerus amputation   Social History:  reports that he has been smoking Cigarettes.  He has been smoking about 0.50 packs per day. He has quit using smokeless tobacco. He reports that he drinks about 3.6 oz of alcohol per week . He reports that he uses drugs, including Marijuana.  Family History: family history is not on file.   Allergies: Tramadol    Physical Exam           ED Triage Vitals [05/16/17 1243]   BP Temp Temp Source Pulse Resp SpO2 Height Weight   (!) 121/58  97.9 F (36.6 C) Oral 56 14 100 % 6\' 2"  (1.88 m) 165 lb (74.8 kg)     Oxygen Saturation Interpretation: Normal.    Constitutional:  Alert, development consistent with age.  HEENT:  NC/NT.  Airway patent.  Eyes:  PERRL, EOMI, no discharge.   Ears:  TMs without perforation, injection, or bulging.  External canals clear without exudate.  Mouth:  Mucous membranes moist without lesions, tongue and gums normal.  Throat:  Pharynx without injection, exudate, or tonsillar hypertrophy.  Airway patient.  Neck:  Supple.  No lymphadenopathy.  Respiratory:  Clear to auscultation and breath sounds equal.  CV:  Regular rate and rhythm.  GI:  Abdomen Soft, nontender, +BS.  Integument:  Skin turgor: Normal.              tenderness, swelling and no erythema, rash or swelling noted.  Neurological:  Orientation age-appropriate unless noted elseware.  Motor functions intact.    Lab / Imaging Results   (All laboratory and radiology results have been personally reviewed by myself)  Labs:  No results found for this visit on 05/16/17.    Imaging:  All Radiology results interpreted by  Radiologist unless otherwise noted.  XR HUMERUS RIGHT (MIN 2 VIEWS)   Final Result   No acute fracture is identified. Followup imaging can be   performed for persistent or worsening symptoms.             ED Course / Medical Decision Making     Medications   dexamethasone (DECADRON) injection 10 mg (10 mg Intramuscular Given 05/16/17 1330)   diphenhydrAMINE (BENADRYL) tablet 25 mg (25 mg Oral Given 05/16/17 1331)   famotidine (PEPCID) tablet 20 mg (20 mg Oral Given 05/16/17 1331)   oxyCODONE-acetaminophen (PERCOCET) 5-325 MG per tablet 1 tablet (1 tablet Oral Given 05/16/17 1331)        Consults:   None    Procedures:   none    MDM:   Patient was treated for poison ivy and discharged home to follow up with his PCP. Patient requested a referral to Dr. Charm BargesButler to evaluate his below the shoulder amputation as he is having phantom limb pain.    Counseling:    The  emergency provider has spoken with the patient and discussed today's results, in addition to providing specific details for the plan of care and counseling regarding the diagnosis and prognosis.  Questions are answered at this time and they are agreeable with the plan.  Assessment      1. Rash and other nonspecific skin eruption    2. Right arm pain    3. Phantom limb syndrome with pain [facility](HCC)      Plan   Discharge to home  Patient condition is stable    New Medications     Discharge Medication List as of 05/16/2017  2:02 PM      START taking these medications    Details   methylPREDNISolone (MEDROL, PAK,) 4 MG tablet Take as directed on package insert days 1-6, Disp-21 tablet, R-zeroPrint      diphenhydrAMINE (BENADRYL) 25 MG capsule Take 1 capsule by mouth every 6 hours as needed for Itching, Disp-20 capsule, R-0Print      famotidine (PEPCID) 20 MG tablet Take 1 tablet by mouth 2 times daily for 7 days, Disp-14 tablet, R-0Print      !! oxyCODONE-acetaminophen (PERCOCET) 5-325 MG per tablet Take 1 tablet by mouth every 6 hours as needed for Pain for up to 3 days.., Disp-10 tablet, R-0Print       !! - Potential duplicate medications found. Please discuss with provider.        Electronically signed by Levy Sjogrenracy Azazel Franze, APRN - CNP   DD: 05/16/17  **This report was transcribed using voice recognition software. Every effort was made to ensure accuracy; however, inadvertent computerized transcription errors may be present.  END OF ED PROVIDER NOTE     Levy Sjogrenracy Rylen Hou, APRN - CNP  05/18/17 1903

## 2017-05-16 NOTE — ED Notes (Signed)
Discharge instructions reviewed , including diagnosis, medications, follow up appointments, home care, and also when to call 911.  All discharge instructions questions answered.         Pt left ED ambulatory         Geradine GirtChelsea E Javi Bollman, RN  05/16/17 1407

## 2017-05-22 ENCOUNTER — Encounter: Payer: Medicaid Other | Attending: Orthopaedic Surgery

## 2017-12-16 ENCOUNTER — Emergency Department: Admit: 2017-12-17 | Payer: MEDICAID

## 2017-12-16 DIAGNOSIS — S60222A Contusion of left hand, initial encounter: Secondary | ICD-10-CM

## 2017-12-16 NOTE — ED Provider Notes (Signed)
[facility]  [facility]PI:  12/16/17, Time: 7:30 PM         [name] [name] is a 32 y.o. male presenting to the ED for left hand pain.  Patient reports that one day prior he fell on ice landing on his left hand.  Patient does have some noted soft tissue swelling at the base of the fifth metacarpal.  He denies any injury to the wrist.  Patient was concerned because he has had hand surgery in the past by Dr. Charm BargesButler and 2016.  Patient reports taking Tylenol without complete relief.  He denies any numbness or tingling.  Patient was concerned for possible fracture.    Review of Systems:   Pertinent positives and negatives are stated within HPI, all other systems reviewed and are negative.          --------------------------------------------- PAST HISTORY ---------------------------------------------  Past Medical History:  has a past medical history of Pain; PTSD (post-traumatic stress disorder); and Seizures (HCC).    Past Surgical History:  has a past surgical history that includes Arm Amputation; other surgical history (Right, 01/20/2015); and Finger fracture surgery (Left, 03/24/2015).    Social History:  reports that he has been smoking Cigarettes.  He has been smoking about 0.50 packs per day. He has quit using smokeless tobacco. He reports that he drinks about 3.6 oz of alcohol per week . He reports that he uses drugs, including Marijuana.    Family History: family history is not on file.     The patient's home medications have been reviewed.    Allergies: Tramadol    -------------------------------------------------- RESULTS -------------------------------------------------  All laboratory and radiology results have been personally reviewed by myself   LABS:  No results found for this visit on 12/16/17.    RADIOLOGY:  Interpreted by Radiologist.  XR HAND LEFT (MIN 3 VIEWS)   Final Result   No acute fractures or dislocations in the left hand.          ------------------------- NURSING NOTES AND VITALS REVIEWED  ---------------------------   The nursing notes within the ED encounter and vital signs as below have been reviewed.   BP 116/64   Pulse 80   Temp 98.3 F (36.8 C)   Resp 16   Ht 6\' 2"  (1.88 m)   Wt 195 lb (88.5 kg)   SpO2 99%   BMI 25.04 kg/m   Oxygen Saturation Interpretation: Normal      ---------------------------------------------------PHYSICAL EXAM--------------------------------------      Constitutional/General: Alert and oriented x3, well appearing, non toxic in NAD  Head: Normocephalic and atraumatic  Eyes: PERRL, EOMI  Mouth: Oropharynx clear, handling secretions, no trismus  Neck: Supple, full ROM,   Pulmonary: Lungs clear to auscultation bilaterally, no wheezes, rales, or rhonchi. Not in respiratory distress  Cardiovascular:  Regular rate and rhythm, no murmurs, gallops, or rubs. 2+ distal pulses  Abdomen: Soft, non tender, non distended,   Extremities: Moves all extremities x 4. Warm and well perfused, patient with mild soft tissue swelling to left fifth metacarpal dorsum aspect of hand.  Radial pulse intact.  Sensation is fully intact.  Grasp 5 out of 5.  Pronation and supination intact.  Skin: warm and dry without rash  Neurologic: GCS 15,  Psych: Normal Affect      ------------------------------ ED COURSE/MEDICAL DECISION MAKING----------------------  Medications   oxyCODONE-acetaminophen (PERCOCET) 5-325 MG per tablet 1 tablet (1 tablet Oral Given 12/16/17 1941)         ED COURSE:  Medical Decision Making: Patient was provided with ice as well as Percocet for pain relief.  Imaging resulted it is negative for any fracture.  Patient was made aware of these results.  Patient will be discharged home with follow-up by his primary care physician.  Ace wrap was applied.  Patient educated on rest, ice, compression and elevation as well as returning back to the emergency department if he develops any numbness or tingling or any other new additional concerns.  Patient is neurovascularly intact  patient expressed understanding and discharged home.       Counseling:   The emergency provider has spoken with the patient and discussed today's results, in addition to providing specific details for the plan of care and counseling regarding the diagnosis and prognosis.  Questions are answered at this time and they are agreeable with the plan.      --------------------------------- IMPRESSION AND DISPOSITION ---------------------------------    IMPRESSION  1. Contusion of left hand, initial encounter        DISPOSITION  Disposition: Discharge to home  Patient condition is good      NOTE: This report was transcribed using voice recognition software. Every effort was made to ensure accuracy; however, inadvertent computerized transcription errors may be present     Ellery Plunk, APRN - CNP  12/17/17 0444

## 2017-12-17 ENCOUNTER — Inpatient Hospital Stay: Admit: 2017-12-17 | Discharge: 2017-12-17 | Disposition: A | Discharge status: Home / Self Care | Payer: MEDICAID

## 2017-12-17 MED ORDER — IBUPROFEN 800 MG PO TABS
800 MG | ORAL_TABLET | Freq: Three times a day (TID) | ORAL | 0 refills | Status: DC | PRN
Start: 2017-12-17 — End: 2018-01-13

## 2017-12-17 MED ORDER — OXYCODONE-ACETAMINOPHEN 5-325 MG PO TABS
5-325 MG | Freq: Once | ORAL | Status: AC
Start: 2017-12-17 — End: 2017-12-16
  Administered 2017-12-17: 01:00:00 1 via ORAL

## 2017-12-17 MED FILL — OXYCODONE-ACETAMINOPHEN 5-325 MG PO TABS: 5-325 MG | ORAL | Qty: 1

## 2018-01-11 ENCOUNTER — Inpatient Hospital Stay: Admit: 2018-01-11 | Discharge: 2018-01-11 | Discharge status: Against Medical Advice | Payer: PRIVATE HEALTH INSURANCE

## 2018-01-11 DIAGNOSIS — Z5329 Procedure and treatment not carried out because of patient's decision for other reasons: Secondary | ICD-10-CM

## 2018-01-11 NOTE — ED Notes (Signed)
Called and not answering     Hurley Cisco, RN  01/11/18 [phone]

## 2018-01-13 ENCOUNTER — Inpatient Hospital Stay
Admit: 2018-01-13 | Discharge: 2018-01-13 | Disposition: A | Discharge status: Home / Self Care | Payer: PRIVATE HEALTH INSURANCE

## 2018-01-13 DIAGNOSIS — K047 Periapical abscess without sinus: Secondary | ICD-10-CM

## 2018-01-13 MED ORDER — KETOROLAC TROMETHAMINE 60 MG/2ML IM SOLN
60 MG/2ML | Freq: Once | INTRAMUSCULAR | Status: AC
Start: 2018-01-13 — End: 2018-01-13
  Administered 2018-01-13: 19:00:00 60 mg via INTRAMUSCULAR

## 2018-01-13 MED ORDER — OXYCODONE-ACETAMINOPHEN 5-325 MG PO TABS
5-325 MG | Freq: Once | ORAL | Status: AC
Start: 2018-01-13 — End: 2018-01-13
  Administered 2018-01-13: 19:00:00 1 via ORAL

## 2018-01-13 MED ORDER — IBUPROFEN 800 MG PO TABS
800 MG | ORAL_TABLET | Freq: Three times a day (TID) | ORAL | 1 refills | Status: DC | PRN
Start: 2018-01-13 — End: 2018-03-12

## 2018-01-13 MED ORDER — MAGIC MOUTHWASH
Freq: Four times a day (QID) | 0 refills | Status: DC | PRN
Start: 2018-01-13 — End: 2018-03-12

## 2018-01-13 MED ORDER — AMOXICILLIN-POT CLAVULANATE 875-125 MG PO TABS
875-125 MG | Freq: Once | ORAL | Status: AC
Start: 2018-01-13 — End: 2018-01-13
  Administered 2018-01-13: 21:00:00 1 via ORAL

## 2018-01-13 MED ORDER — AMOXICILLIN-POT CLAVULANATE 875-125 MG PO TABS
875-125 MG | ORAL_TABLET | Freq: Two times a day (BID) | ORAL | 0 refills | Status: AC
Start: 2018-01-13 — End: 2018-01-23

## 2018-01-13 MED ORDER — AMOXICILLIN-POT CLAVULANATE 875-125 MG PO TABS
875-125 MG | Freq: Two times a day (BID) | ORAL | Status: DC
Start: 2018-01-13 — End: 2018-01-13

## 2018-01-13 MED FILL — OXYCODONE-ACETAMINOPHEN 5-325 MG PO TABS: 5-325 MG | ORAL | Qty: 1

## 2018-01-13 MED FILL — KETOROLAC TROMETHAMINE 60 MG/2ML IM SOLN: 60 MG/2ML | INTRAMUSCULAR | Qty: 2

## 2018-01-13 MED FILL — AMOXICILLIN-POT CLAVULANATE 875-125 MG PO TABS: 875-125 MG | ORAL | Qty: 1

## 2018-01-13 NOTE — Progress Notes (Signed)
Patient contacted dental clinic to schedule follow-up appt for drain removal and extractions. He was upset that we did not accept his out of state insurance. Discussed our financial assistance and that a one time $30 copay was needed. Patient refused to schedule with us and stated he would go back to the ED to have the drain removed or her would take it out himself. We informed patient that he would have to come to the dental clinic for follow-up treatment and not the ED. Patient hung up on us.

## 2018-01-13 NOTE — Progress Notes (Signed)
S: Pt presents to ED with LR swelling.   Pain started approx five days ago, swelling four days ago    O: Age: [age]  Race: C  Gender: M        ASA: ll   Allergies: tramadol    PMH- seizures, PTSD, arm amputation, phantom limb syndrome    A: gross decay #27,28       Retained root tip #29    P: Patient presents with moderate intra oral and mild extra oral swelling along body of mandible. Intra oral swelling on posterior right #27-30 area.   #27,28 have gross decay. #29 retained root tip present.   Intra oral swelling in buccal vestibule, no lingual swelling present. No tongue deviation or current airway concerns.   Local anesthesia achieved via topical 20% benzocaine, 1 carpule 4% septocaine and 1 carpule 2% lidocaine 1:100,000 epi. Incised at depth of vestibule over largest areas of swelling buccal to #29 with 15 blade. Incision extended with hemostats. Some drainage produced. Penrose drain placed with vicryl sutures. Patient to be sent home on Augmentin.   Post-op instructions given with emphasis to return to ED if swelling worsens or any difficulty breathing or swallowing.   Patient will be contacted by dental clinic to make appt for drain removal and extractions next week.     Electronically signed by Starleen BlueJeana K Lashuna Tamashiro, DDS on 01/13/2018 at 4:07 PM

## 2018-01-13 NOTE — ED Provider Notes (Signed)
[facility]  ??  Department of Emergency Medicine   ED  Provider Note  Admit Date/RoomTime: 01/13/2018  2:31 PM  ED Room: 38/38   Chief Complaint   Dental Pain (swelling x 2 days on right side)    History of Present Illness   Source of history provided by:  patient.  History/Exam Limitations: none.       [name] [name] is a 32 y.o. old male who has a past medical history of:   Past Medical History:   Diagnosis Date   ??? Pain     right arm, amputation site   ??? 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   ??? Seizures (HCC)     last one 10/2014    presents to the emergency department by private vehicle, for right lower tooth pain, which occured a few day(s) prior to arrival.  Since onset the symptoms have been constant and moderate in severity.  Worsened by  heat, cold and chewing and improved by nothing.  Associated Signs & Symptoms:  Facial pain. He denies any fever or chills, and does not have a dentist.           Onset:       Spontaneous:   yes.     Following Trauma:   no.     Previous Caries:   yes.     Recent Dental Procedure:   no.     ROS    Pertinent positives and negatives are stated within HPI, all other systems reviewed and are negative..    Past Surgical History:  has a past surgical history that includes Arm Amputation; other surgical history (Right, 01/20/2015); and Finger fracture surgery (Left, 03/24/2015).  Social History:  reports that he has been smoking cigarettes.  He has been smoking about 1.00 pack per day. He has quit using smokeless tobacco. He reports that he drinks about 3.6 oz of alcohol per week. He reports that he has current or past drug history. Drug: Marijuana.  Family History: family history is not on file.   Allergies: Tramadol    Physical Exam           ED Triage Vitals   BP Temp Temp src Pulse Resp SpO2 Height Weight   01/13/18 1428 01/13/18 1428 -- 01/13/18 1417 01/13/18 1428 01/13/18 1417 01/13/18 1428 01/13/18 1428   119/66 98.1 ??F (36.7  ??C)  82 14 98 % 6\' 2"  (1.88 m) 165 lb (74.8 kg)      Oxygen Saturation Interpretation: Normal.    ?? Constitutional:  Alert, development consistent with age.  ?? HEENT:  NC/NT.  Airway patent.  ?? Neck:  Supple. Normal ROM.  ?? Lips:  upper and lower normal.  ?? Mouth:  normal tongue and buccal mucosa.  ?? Dental:  Overall poor dentition, TTP tooth # 27, 28 with surrounding induration and fluctuance.                    Trismus: No.         Drooling: No.           Airway stridor: No.  ?? Facial skin: right tenderness and swelling.   ?? Respiratory:  Clear to auscultation and breath sounds equal.    ?? CV: Regular rate and rhythm, normal heart sounds, without pathological murmurs, ectopy, gallops, or rubs.  ?? Skin:  No rashes, erythema or lesions present, unless noted elsewhere..  ?? Lymphatics:  No lymphangitis or adenopathy noted.  ?? Neurological:  Oriented.  Motor functions intact.    Lab / Imaging Results   (All laboratory and radiology results have been personally reviewed by myself)  Labs:  No results found for this visit on 01/13/18.  Imaging:  All Radiology results interpreted by Radiologist unless otherwise noted.  No orders to display     ED Course / Medical Decision Making     Medications   oxyCODONE-acetaminophen (PERCOCET) 5-325 MG per tablet 1 tablet (1 tablet Oral Given 01/13/18 1457)   ketorolac (TORADOL) injection 60 mg (60 mg Intramuscular Given 01/13/18 1457)   amoxicillin-clavulanate (AUGMENTIN) 875-125 MG per tablet 1 tablet (1 tablet Oral Given 01/13/18 1635)        Consult(s):   Dental Resident was consulted to see patient regarding complaint, evaluated and I+D dental abscess in ED see separate noted.    Procedure(s):    none.    Counseling/MDM:  Dental abscess, no airway obstruction, I+D while in ED.Plan is for symptom control and outpatient follow up with dentist.  The emergency provider has spoken with the patient and discussed today???s results, in addition to providing specific details for the plan of care  and counseling regarding the diagnosis and prognosis.  Questions are answered at this time and they are agreeable with the plan. Educated on signs and symptoms which require emergent evaluation.    Assessment      1. Dental abscess      Plan   Discharge to home  Patient condition is good    New Medications     Discharge Medication List as of 01/13/2018  3:59 PM      START taking these medications    Details   amoxicillin-clavulanate (AUGMENTIN) 875-125 MG per tablet Take 1 tablet by mouth 2 times daily (with meals) for 10 days, Disp-20 tablet, R-0Print      Magic Mouthwash (MIRACLE MOUTHWASH) Swish and spit 5 mLs 4 times daily as needed for Irritation, Disp-240 mL, R-0Print           Electronically signed by Henrietta Dine, APRN - CNP   DD: 01/13/18  **This report was transcribed using voice recognition software. Every effort was made to ensure accuracy; however, inadvertent computerized transcription errors may be present.  END OF ED PROVIDER NOTE      Ezekiel Slocumb, APRN - CNP  01/16/18 [phone]

## 2018-03-10 ENCOUNTER — Emergency Department: Admit: 2018-03-11 | Payer: PRIVATE HEALTH INSURANCE

## 2018-03-10 DIAGNOSIS — S0990XA Unspecified injury of head, initial encounter: Secondary | ICD-10-CM

## 2018-03-10 NOTE — ED Notes (Signed)
Bed: 09  Expected date:   Expected time:   Means of arrival:   Comments:  Hold EMS     Loanne Drilling, [place]  03/10/18 2120

## 2018-03-10 NOTE — ED Provider Notes (Signed)
Jourdan [place] is a 32 year old male who presents the ED with a complaint of fall. Patient states that he was helping take laundry downstairs when he lost his footing and fell down 9 steps. Patient states that he did hit his head but denies any loss of consciousness. He states that he is having neck pain, back pain and pain to his chest since the fall.  He describes the pain as an aching sensation that becomes sharp with movement. He currently rates the pain as a 10 out of 10 on a pain scale. He denies any shortness of breath, pain, nausea, vomiting, visual changes, extremity weakness or sensory deficits.          Review of Systems   Constitutional: Negative for chills and fever.   HENT: Negative for ear pain, sinus pressure and sore throat.    Eyes: Negative for pain, discharge and redness.   Respiratory: Negative for cough, shortness of breath and wheezing.    Cardiovascular: Positive for chest pain.   Gastrointestinal: Negative for abdominal pain, diarrhea, nausea and vomiting.   Genitourinary: Negative for dysuria and frequency.   Musculoskeletal: Positive for back pain and neck pain. Negative for arthralgias.   Skin: Negative for rash and wound.   Neurological: Negative for weakness and headaches.   Hematological: Negative for adenopathy.   All other systems reviewed and are negative.      Physical Exam   Constitutional: He is oriented to person, place, and time. He appears well-developed and well-nourished.   HENT:   Head: Normocephalic and atraumatic.   Eyes: Pupils are equal, round, and reactive to light.   Neck: Neck supple.   C-collar in place  Tenderness to palpation to midline cervical region   Cardiovascular: Normal rate, regular rhythm and normal heart sounds.   2+ pulses to femoral pulse, dorsalis pedis, posterior tib bilaterally   Pulmonary/Chest: Effort normal and breath sounds normal. No respiratory distress. He has no wheezes. He has no rales.   Abdominal: Soft. Bowel sounds are normal. There is  no tenderness. There is no rebound and no guarding.   Musculoskeletal: He exhibits no edema.   ttp  to the midline thoracic and lumbar region   Neurological: He is alert and oriented to person, place, and time. No cranial nerve deficit. Coordination normal.   No sensory deficits   Skin: Skin is warm and dry.   Nursing note and vitals reviewed.      Procedures    MDM  Number of Diagnoses or Management Options  Acute strain of neck muscle, initial encounter:   Back strain, initial encounter:   Fall, initial encounter:   Injury of head, initial encounter:   Diagnosis management comments: Patient presented with pain to neck, back and chest after falling down 9 steps. Patient was in a c-collar. Plan is for labs, CT head, cervical, thoracic, lumbar spine and chest. Imaging was negative. Patient was given fentanyl for pain control. Labs were unremarkable. Patients cervical spine was cleared. Patient was discharged with Flexeril and Naprosyn. He was given head injury precautions. He was given strict return instructions.                --------------------------------------------- PAST HISTORY ---------------------------------------------  Past Medical History:  has a past medical history of Pain, PTSD (post-traumatic stress disorder), and Seizures (HCC).    Past Surgical History:  has a past surgical history that includes Arm Amputation; other surgical history (Right, 01/20/2015); and Finger fracture surgery (Left, 03/24/2015).  Social History:  reports that he has been smoking cigarettes.  He has been smoking about 1.00 pack per day. He has quit using smokeless tobacco. He reports that he drinks about 3.6 oz of alcohol per week. He reports that he has current or past drug history. Drug: Marijuana.    Family History: family history is not on file.     The patient???s home medications have been reviewed.    Allergies: Tramadol    -------------------------------------------------- RESULTS  -------------------------------------------------  Labs:  Results for orders placed or performed during the hospital encounter of 03/10/18   CBC Auto Differential   Result Value Ref Range    WBC 4.6 4.5 - 11.5 E9/L    RBC 3.67 (L) 3.80 - 5.80 E12/L    Hemoglobin 11.9 (L) 12.5 - 16.5 g/dL    Hematocrit 16.1 (L) 37.0 - 54.0 %    MCV 96.7 80.0 - 99.9 fL    MCH 32.4 26.0 - 35.0 pg    MCHC 33.5 32.0 - 34.5 %    RDW 13.2 11.5 - 15.0 fL    Platelets 153 130 - 450 E9/L    MPV 9.9 7.0 - 12.0 fL    Neutrophils % 40.6 (L) 43.0 - 80.0 %    Immature Granulocytes % 0.2 0.0 - 5.0 %    Lymphocytes % 49.5 (H) 20.0 - 42.0 %    Monocytes % 7.8 2.0 - 12.0 %    Eosinophils % 1.7 0.0 - 6.0 %    Basophils % 0.2 0.0 - 2.0 %    Neutrophils # 1.86 1.80 - 7.30 E9/L    Immature Granulocytes # 0.01 E9/L    Lymphocytes # 2.27 1.50 - 4.00 E9/L    Monocytes # 0.36 0.10 - 0.95 E9/L    Eosinophils # 0.08 0.05 - 0.50 E9/L    Basophils # 0.01 0.00 - 0.20 E9/L   Comprehensive Metabolic Panel   Result Value Ref Range    Sodium 143 132 - 146 mmol/L    Potassium 3.7 3.5 - 5.0 mmol/L    Chloride 107 98 - 107 mmol/L    CO2 27 22 - 29 mmol/L    Anion Gap 9 7 - 16 mmol/L    Glucose 103 (H) 74 - 99 mg/dL    BUN 12 6 - 20 mg/dL    CREATININE 0.9 0.7 - 1.2 mg/dL    GFR Non-African American >60 >=60 mL/min/1.73    GFR African American >60     Calcium 9.2 8.6 - 10.2 mg/dL    Total Protein 7.0 6.4 - 8.3 g/dL    Alb 4.2 3.5 - 5.2 g/dL    Total Bilirubin 0.2 0.0 - 1.2 mg/dL    Alkaline Phosphatase 71 40 - 129 U/L    ALT 17 0 - 40 U/L    AST 24 0 - 39 U/L   Lipase   Result Value Ref Range    Lipase 18 13 - 60 U/L   Troponin   Result Value Ref Range    Troponin <0.01 0.00 - 0.03 ng/mL   APTT   Result Value Ref Range    aPTT 29.0 24.5 - 35.1 sec   Protime-INR   Result Value Ref Range    Protime 12.1 9.3 - 12.4 sec    INR 1.0    EKG 12 Lead   Result Value Ref Range    Ventricular Rate 49 BPM    Atrial Rate 49 BPM    P-R Interval  178 ms    QRS Duration 90 ms    Q-T  Interval 428 ms    QTc Calculation (Bazett) 386 ms    P Axis 42 degrees    R Axis 81 degrees    T Axis 68 degrees       Radiology:  CT CERVICAL SPINE WO CONTRAST   Final Result      Negative CT of the cervical spine.      CT Thoracic Spine WO Contrast   Final Result   1. Mild loss of height of several of mid lower thoracic vertebral   bodies of undetermined age. See above comments and recommendations.      CT Lumbar Spine WO Contrast   Final Result      Negative CT of the lumbar spine.      CT Chest W Contrast   Final Result   No indication for an acute trauma injury to the   intrathoracic structures or to the bone structures of the chest wall.      CT HEAD WO CONTRAST   Final Result   No indication for an acute intracranial process.          ------------------------- NURSING NOTES AND VITALS REVIEWED ---------------------------  Date / Time Roomed:  03/10/2018  9:20 PM  ED Bed Assignment:  09/09    The nursing notes within the ED encounter and vital signs as below have been reviewed.   BP (!) 140/74    Pulse 65    Temp 97.8 ??F (36.6 ??C) (Temporal)    Resp 16    Ht  (1.88 m)    Wt 185 lb (83.9 kg)    SpO2 100%    BMI 23.75 kg/m??   Oxygen Saturation Interpretation: Normal      ------------------------------------------ PROGRESS NOTES ------------------------------------------  I have spoken with the patient and discussed today???s results, in addition to providing specific details for the plan of care and counseling regarding the diagnosis and prognosis.  Their questions are answered at this time and they are agreeable with the plan. I discussed at length with them reasons for immediate return here for re evaluation. They will followup with primary care by calling their office tomorrow.      --------------------------------- ADDITIONAL PROVIDER NOTES ---------------------------------  At this time the patient is without objective evidence of an acute process requiring hospitalization or inpatient management.  They  have remained hemodynamically stable throughout their entire ED visit and are stable for discharge with outpatient follow-up.     The plan has been discussed in detail and they are aware of the specific conditions for emergent return, as well as the importance of follow-up.      New Prescriptions    CYCLOBENZAPRINE (FLEXERIL) 5 MG TABLET    Take 1 tablet by mouth 2 times daily as needed for Muscle spasms    NAPROXEN (NAPROSYN) 500 MG TABLET    Take 1 tablet by mouth 2 times daily for 7 days       Diagnosis:  1. Injury of head, initial encounter    2. Fall, initial encounter    3. Acute strain of neck muscle, initial encounter    4. Back strain, initial encounter        Disposition:  Patient's disposition: Discharge to home  Patient's condition is stable.            Janean Sark, DO  Resident  03/11/18 [phone]

## 2018-03-11 ENCOUNTER — Inpatient Hospital Stay
Admit: 2018-03-11 | Discharge: 2018-03-11 | Disposition: A | Discharge status: Home / Self Care | Payer: PRIVATE HEALTH INSURANCE | Attending: Emergency Medicine

## 2018-03-11 ENCOUNTER — Emergency Department: Admit: 2018-03-12 | Payer: PRIVATE HEALTH INSURANCE

## 2018-03-11 DIAGNOSIS — R42 Dizziness and giddiness: Secondary | ICD-10-CM

## 2018-03-11 LAB — EKG 12-LEAD
Atrial Rate: 49 {beats}/min
P Axis: 42 degrees
P-R Interval: 178 ms
Q-T Interval: 428 ms
QRS Duration: 90 ms
QTc Calculation (Bazett): 386 ms
R Axis: 81 degrees
T Axis: 68 degrees
Ventricular Rate: 49 {beats}/min

## 2018-03-11 LAB — COMPREHENSIVE METABOLIC PANEL
ALT: 17 U/L (ref 0–40)
AST: 24 U/L (ref 0–39)
Albumin: 4.2 g/dL (ref 3.5–5.2)
Alkaline Phosphatase: 71 U/L (ref 40–129)
Anion Gap: 9 mmol/L (ref 7–16)
BUN: 12 mg/dL (ref 6–20)
CO2: 27 mmol/L (ref 22–29)
Calcium: 9.2 mg/dL (ref 8.6–10.2)
Chloride: 107 mmol/L (ref 98–107)
Creatinine: 0.9 mg/dL (ref 0.7–1.2)
GFR African American: >60
GFR Non-African American: >60 mL/min/{1.73_m2} (ref >=60)
Glucose: 103 mg/dL — ABNORMAL HIGH (ref 74–99)
Potassium: 3.7 mmol/L (ref 3.5–5.0)
Sodium: 143 mmol/L (ref 132–146)
Total Bilirubin: 0.2 mg/dL (ref 0.0–1.2)
Total Protein: 7 g/dL (ref 6.4–8.3)

## 2018-03-11 LAB — CBC WITH AUTO DIFFERENTIAL
Basophils %: 0.2 % (ref 0.0–2.0)
Basophils Absolute: 0.01 E9/L (ref 0.00–0.20)
Eosinophils %: 1.7 % (ref 0.0–6.0)
Eosinophils Absolute: 0.08 E9/L (ref 0.05–0.50)
Hematocrit: 35.5 % — ABNORMAL LOW (ref 37.0–54.0)
Hemoglobin: 11.9 g/dL — ABNORMAL LOW (ref 12.5–16.5)
Immature Granulocytes #: 0.01 E9/L
Immature Granulocytes %: 0.2 % (ref 0.0–5.0)
Lymphocytes %: 49.5 % — ABNORMAL HIGH (ref 20.0–42.0)
Lymphocytes Absolute: 2.27 E9/L (ref 1.50–4.00)
MCH: 32.4 pg (ref 26.0–35.0)
MCHC: 33.5 % (ref 32.0–34.5)
MCV: 96.7 fL (ref 80.0–99.9)
MPV: 9.9 fL (ref 7.0–12.0)
Monocytes %: 7.8 % (ref 2.0–12.0)
Monocytes Absolute: 0.36 E9/L (ref 0.10–0.95)
Neutrophils %: 40.6 % — ABNORMAL LOW (ref 43.0–80.0)
Neutrophils Absolute: 1.86 E9/L (ref 1.80–7.30)
Platelets: 153 E9/L (ref 130–450)
RBC: 3.67 E12/L — ABNORMAL LOW (ref 3.80–5.80)
RDW: 13.2 fL (ref 11.5–15.0)
WBC: 4.6 E9/L (ref 4.5–11.5)

## 2018-03-11 LAB — PROTIME-INR
INR: 1
Protime: 12.1 s (ref 9.3–12.4)

## 2018-03-11 LAB — APTT: aPTT: 29 s (ref 24.5–35.1)

## 2018-03-11 LAB — TROPONIN: Troponin: <0.01 ng/mL (ref 0.00–0.03)

## 2018-03-11 LAB — LIPASE: Lipase: 18 U/L (ref 13–60)

## 2018-03-11 MED ORDER — IOPAMIDOL 76 % IV SOLN
76 % | Freq: Once | INTRAVENOUS | Status: AC | PRN
Start: 2018-03-11 — End: 2018-03-10
  Administered 2018-03-11: 03:00:00 90 mL via INTRAVENOUS

## 2018-03-11 MED ORDER — CYCLOBENZAPRINE HCL 5 MG PO TABS
5 MG | ORAL_TABLET | Freq: Two times a day (BID) | ORAL | 0 refills | Status: AC | PRN
Start: 2018-03-11 — End: 2018-03-21

## 2018-03-11 MED ORDER — NAPROXEN 500 MG PO TABS
500 MG | ORAL_TABLET | Freq: Two times a day (BID) | ORAL | 0 refills | Status: AC
Start: 2018-03-11 — End: 2018-03-18

## 2018-03-11 MED ORDER — FENTANYL CITRATE (PF) 100 MCG/2ML IJ SOLN
100 MCG/2ML | Freq: Once | INTRAMUSCULAR | Status: AC
Start: 2018-03-11 — End: 2018-03-10
  Administered 2018-03-11: 02:00:00 50 ug via INTRAVENOUS

## 2018-03-11 MED FILL — FENTANYL CITRATE (PF) 100 MCG/2ML IJ SOLN: 100 MCG/2ML | INTRAMUSCULAR | Qty: 2

## 2018-03-11 NOTE — ED Provider Notes (Signed)
The patient is a 32 year old male with a history of seizure disorder and bipolar who presents to the emergency department with complaint of worsening headache and neck pain with intermittent numbness and tingling down both arms and legs since being seen in the emergency department yesterday at [facility]. [facility]. The patient was evaluated after falling down a flight of stairs. He was pain scanned and had no positive findings on CT scan of his head, cervical spine, thoracic spine, and lumbar spine. The patient was discharged home with prescriptions for Flexeril and naproxen as needed for pain. The patient said that since then his pain has been uncontrolled and intermittently when he moves his head to one side or the other he expresses numbness down his arms and legs. He also states he is not having a migraine and feels dizzy with blurred vision. He also reports that he had a seizure today. He refused to elaborate on the seizure characteristics stating that he started time multiple people what happened. He states that normally he has seizures when he is sleeping. He is not on any medications for seizures.    The history is provided by the patient.   Illness    The current episode started yesterday. The onset was sudden. The problem occurs continuously. The problem has been gradually worsening. The problem is severe. Nothing relieves the symptoms. The symptoms are aggravated by movement and activity. Associated symptoms include headaches. Pertinent negatives include no fever, no abdominal pain, no constipation, no diarrhea, no nausea, no vomiting, no congestion, no ear pain, no rhinorrhea, no sore throat, no neck pain, no cough and no rash.       Review of Systems   Constitutional: Negative for activity change, appetite change, chills, diaphoresis, fatigue and fever.   HENT: Negative for congestion, ear pain, facial swelling, rhinorrhea, sinus pain, sore throat, trouble swallowing and voice change.     Eyes: Negative for visual disturbance.   Respiratory: Negative for cough and shortness of breath.    Cardiovascular: Negative for chest pain, palpitations and leg swelling.   Gastrointestinal: Negative for abdominal pain, constipation, diarrhea, nausea and vomiting.   Endocrine: Negative for polyuria.   Genitourinary: Negative for decreased urine volume, difficulty urinating, dysuria, flank pain and hematuria.   Musculoskeletal: Negative for arthralgias, back pain, gait problem, joint swelling, myalgias, neck pain and neck stiffness.   Skin: Negative for color change, pallor, rash and wound.   Allergic/Immunologic: Negative for immunocompromised state.   Neurological: Positive for dizziness, seizures, light-headedness, numbness and headaches. Negative for tremors, syncope and weakness.   Hematological: Negative for adenopathy. Does not bruise/bleed easily.   Psychiatric/Behavioral: Negative.        Physical Exam   Constitutional: He is oriented to person, place, and time. He appears well-developed and well-nourished.  Non-toxic appearance. He does not have a sickly appearance. He does not appear ill. No distress.   C-collar in place   HENT:   Head: Normocephalic and atraumatic.   Right Ear: Hearing, tympanic membrane, external ear and ear canal normal.   Left Ear: Hearing, tympanic membrane, external ear and ear canal normal.   Nose: Nose normal. No nasal septal hematoma.   Mouth/Throat: Uvula is midline, oropharynx is clear and moist and mucous membranes are normal. No trismus in the jaw. No uvula swelling. No oropharyngeal exudate, posterior oropharyngeal edema, posterior oropharyngeal erythema or tonsillar abscesses.   Eyes: Pupils are equal, round, and reactive to light. Conjunctivae and EOM are normal. Right eye exhibits  no discharge. Left eye exhibits no discharge. No scleral icterus.   Neck: No JVD present. No tracheal deviation present. No thyromegaly present.   Patient has c-collar in place and neck  tenderness   Cardiovascular: Normal rate, regular rhythm, S1 normal, S2 normal and intact distal pulses. Exam reveals no gallop, no S3, no S4, no distant heart sounds and no friction rub.   No murmur heard.  Pulses:       Radial pulses are 2+ on the left side.   Pulmonary/Chest: Effort normal and breath sounds normal. No accessory muscle usage or stridor. No tachypnea. No respiratory distress. He has no decreased breath sounds. He has no wheezes. He has no rhonchi. He has no rales. He exhibits no tenderness.   Abdominal: Soft. Bowel sounds are normal. He exhibits no distension, no pulsatile midline mass and no mass. There is no tenderness. There is no rigidity, no rebound and no guarding.   Musculoskeletal: Normal range of motion. He exhibits no edema, tenderness or deformity.   Lymphadenopathy:     He has no cervical adenopathy.   Neurological: He is alert and oriented to person, place, and time. He has normal strength. A cranial nerve deficit (Blurred vision) is present. No sensory deficit. Coordination (Difficulty with finger to nose test) abnormal. GCS eye subscore is 4. GCS verbal subscore is 5. GCS motor subscore is 6.   Skin: Skin is warm and dry. Capillary refill takes less than 2 seconds. No rash noted. He is not diaphoretic. No erythema. No pallor.   Psychiatric: His behavior is normal. Judgment and thought content normal. His affect is angry. Cognition and memory are normal.   Nursing note and vitals reviewed.      Procedures    MDM  Patient presents to the ED for headache, neck pain, numbness and tingling of arms and legs, blurred vision and right eye. Differential diagnoses included but not limited to a CHF, according to her, cervical spine fracture, radiculopathy, other neurologic injury. Workup in the ED revealed no acute findings on CT of the head or cervical spine. Laboratory evaluation unremarkable. Patient having blurred vision in the right eye and exam the left vision intact. Extraocular motions  intact. Patient reports intermittent numbness and tingling of his upper and lower extremities when moving his head left and right. Initially had a rigid cervical collar in place. He was allowed to wear a soft cervical collar when the emergency department but this was replaced with a rigid collar after speaking with neurosurgery. Patient was given IV morphine and oral oxycodone for their symptoms with moderate improvement. Patient requires continued workup and management of their symptoms and will be admitted to the hospital for further evaluation and treatment.    ED Course as of Mar 12 121   Mon Mar 11, 2018   2357 Patient reassessed. He states that his pain is 6/10 at this time which is significantly improved. CT scan of the head and cervical spine are unremarkable for any acute fracture, dislocation, subluxation. I'll give a dose of scrotum. He still has blurred vision in the right eye.    [LS]   Tue Mar 12, 2018   8413 Case discussed with Dr. Vista Lawman, neurosurgery. He recommends that the patient be transferred back to [facility]. [facility] to be evaluated by neurology/neurosurgery due to his reports of paresthesias and visual disturbance. His possible may have a ligamentous injury or vascular injury. He'll need further imaging and evaluation. I discussed this with the patient and  he is agreeable to transfer at this time.    [LS]   0101 Case discussed with Dr. Quentin Mulling. He agrees to accept the patient for admission at [facility]. was with [facility].    [LS]      ED Course User Index  [LS] Lula Olszewski, DO     ED Course as of Mar 13 123   Mon Mar 11, 2018   2357 Patient reassessed. He states that his pain is 6/10 at this time which is significantly improved. CT scan of the head and cervical spine are unremarkable for any acute fracture, dislocation, subluxation. I'll give a dose of scrotum. He still has blurred vision in the right eye.    [LS]   Tue Mar 12, 2018   1610 Case discussed with Dr. Vista Lawman,  neurosurgery. He recommends that the patient be transferred back to [facility]. [facility] to be evaluated by neurology/neurosurgery due to his reports of paresthesias and visual disturbance. His possible may have a ligamentous injury or vascular injury. He'll need further imaging and evaluation. I discussed this with the patient and he is agreeable to transfer at this time.    [LS]   0101 Case discussed with Dr. Quentin Mulling. He agrees to accept the patient for admission at [facility]. was with [facility].    [LS]      ED Course User Index  [LS] Lula Olszewski, DO       --------------------------------------------- PAST HISTORY ---------------------------------------------  Past Medical History:  has a past medical history of Pain, PTSD (post-traumatic stress disorder), and Seizures (HCC).    Past Surgical History:  has a past surgical history that includes Arm Amputation; other surgical history (Right, 01/20/2015); and Finger fracture surgery (Left, 03/24/2015).    Social History:  reports that he has been smoking cigarettes.  He has been smoking about 1.00 pack per day. He has quit using smokeless tobacco. He reports that he drinks about 3.6 oz of alcohol per week. He reports that he has current or past drug history. Drug: Marijuana.    Family History: family history is not on file.     The patient???s home medications have been reviewed.    Allergies: Tylenol [acetaminophen]    -------------------------------------------------- RESULTS -------------------------------------------------    Lab  Results for orders placed or performed during the hospital encounter of 03/11/18   Comprehensive Metabolic Panel w/ Reflex to MG   Result Value Ref Range    Sodium 141 132 - 146 mmol/L    Potassium reflex Magnesium 4.4 3.5 - 5.0 mmol/L    Chloride 107 98 - 107 mmol/L    CO2 26 22 - 29 mmol/L    Anion Gap 8 7 - 16 mmol/L    Glucose 83 74 - 99 mg/dL    BUN 12 6 - 20 mg/dL    CREATININE 0.9 0.7 - 1.2 mg/dL    GFR Non-African American  >60 >=60 mL/min/1.73    GFR African American >60     Calcium 8.9 8.6 - 10.2 mg/dL    Total Protein 6.9 6.4 - 8.3 g/dL    Alb 4.2 3.5 - 5.2 g/dL    Total Bilirubin <9.6 0.0 - 1.2 mg/dL    Alkaline Phosphatase 71 40 - 129 U/L    ALT 14 0 - 40 U/L    AST 20 0 - 39 U/L   CBC auto differential   Result Value Ref Range    WBC 4.7 4.5 - 11.5 E9/L    RBC 3.56 (L)  3.80 - 5.80 E12/L    Hemoglobin 11.8 (L) 12.5 - 16.5 g/dL    Hematocrit 16.1 (L) 37.0 - 54.0 %    MCV 98.3 80.0 - 99.9 fL    MCH 33.1 26.0 - 35.0 pg    MCHC 33.7 32.0 - 34.5 %    RDW 13.3 11.5 - 15.0 fL    Platelets 151 130 - 450 E9/L    MPV 10.2 7.0 - 12.0 fL    Neutrophils % 42.3 (L) 43.0 - 80.0 %    Immature Granulocytes % 0.2 0.0 - 5.0 %    Lymphocytes % 47.5 (H) 20.0 - 42.0 %    Monocytes % 7.0 2.0 - 12.0 %    Eosinophils % 2.6 0.0 - 6.0 %    Basophils % 0.4 0.0 - 2.0 %    Neutrophils # 1.98 1.80 - 7.30 E9/L    Immature Granulocytes # 0.01 E9/L    Lymphocytes # 2.23 1.50 - 4.00 E9/L    Monocytes # 0.33 0.10 - 0.95 E9/L    Eosinophils # 0.12 0.05 - 0.50 E9/L    Basophils # 0.02 0.00 - 0.20 E9/L   Troponin   Result Value Ref Range    Troponin <0.01 0.00 - 0.03 ng/mL       Radiology  CT Head WO Contrast   Final Result   1. Unremarkable unenhanced CT of the brain.    2. Normal variant incompletely fused anterior and posterior arches of C1 again    seen.       This report has been electronically signed by Collier Flowers MD.      CT Cervical Spine WO Contrast   Final Result   1. No acute fracture or subluxation of the cervical spine.    2. Additional nonacute/incidental findings as described above.       This report has been electronically signed by Collier Flowers MD.            ------------------------- NURSING NOTES AND VITALS REVIEWED ---------------------------  Date / Time Roomed:  03/11/2018  9:25 PM  ED Bed Assignment:  24/24    The nursing notes within the ED encounter and vital signs as below have been reviewed.   Patient Vitals for the past 24 hrs:   BP Temp  Temp src Pulse Resp SpO2 Height Weight   03/12/18 0104 107/61 98.6 ??F (37 ??C) -- (!) 46 18 100 % -- --   03/11/18 2334 (!) 114/55 -- -- 57 16 99 % -- --   03/11/18 2330 (!) 114/55 -- -- 61 -- (!) 62 % -- --   03/11/18 2315 (!) 114/55 -- -- 54 -- (!) 47 % -- --   03/11/18 2244 (!) 111/58 -- -- 54 -- -- -- --   03/11/18 2202 -- -- -- (!) 45 -- -- -- --   03/11/18 2122 (!) 124/58 98.6 ??F (37 ??C) Oral 68 16 99 %  (1.88 m) 185 lb (83.9 kg)       Oxygen Saturation Interpretation: Normal      ------------------------------------------ PROGRESS NOTES ------------------------------------------    I have spoken with the patient and discussed today???s results, in addition to providing specific details for the plan of care and counseling regarding the diagnosis and prognosis.  Their questions are answered at this time and they are agreeable with the plan.  I have discussed the risks and benefits of transfer and they wish to proceed with the transfer.      ---------------------------------  ADDITIONAL PROVIDER NOTES ---------------------------------  Consultations:  Spoke with Dr. Quentin Mulling (Medicine).  Discussed case.  They will admit this patient.  Spoke with Dr. Vista Lawman (Neurosurgery).  Discussed case.  They will provide consultation.    Reason for transfer: Neurosurgery and neurology services.    This patient's ED course included: a personal history and physicial examination, re-evaluation prior to disposition, multiple bedside re-evaluations, IV medications and complex medical decision making and emergency management    This patient has remained hemodynamically stable, improved and been closely monitored during their ED course.    Please note that the withdrawal or failure to initiate urgent interventions for this patient would likely result in a life threatening deterioration or permanent disability.      Accordingly this patient received 30 minutes of critical care time, excluding separately billable  procedures.    Clinical Impression  1. Blurred vision, right eye    2. Paresthesias    3. Neck pain          Disposition  Patient's disposition: Transfer to [facility]. [facility].  Transferred by: Ambulance.  Patient's condition is stable.         Lula Olszewski, DO  Resident  03/12/18 [phone]

## 2018-03-12 ENCOUNTER — Inpatient Hospital Stay
Admit: 2018-03-12 | Discharge: 2018-03-12 | Disposition: A | Discharge status: Other Acute Inpatient Hospital | Payer: PRIVATE HEALTH INSURANCE | Attending: Emergency Medicine

## 2018-03-12 ENCOUNTER — Inpatient Hospital Stay: Payer: PRIVATE HEALTH INSURANCE

## 2018-03-12 ENCOUNTER — Inpatient Hospital Stay
Admit: 2018-03-12 | Discharge: 2018-03-12 | Discharge status: Against Medical Advice | Payer: PRIVATE HEALTH INSURANCE | Source: Other Acute Inpatient Hospital | Attending: Hospitalist | Admitting: Hospitalist

## 2018-03-12 ENCOUNTER — Encounter

## 2018-03-12 DIAGNOSIS — G629 Polyneuropathy, unspecified: Secondary | ICD-10-CM

## 2018-03-12 DIAGNOSIS — R202 Paresthesia of skin: Secondary | ICD-10-CM

## 2018-03-12 LAB — CBC WITH AUTO DIFFERENTIAL
Basophils %: 0.4 % (ref 0.0–2.0)
Basophils Absolute: 0.02 E9/L (ref 0.00–0.20)
Eosinophils %: 2.6 % (ref 0.0–6.0)
Eosinophils Absolute: 0.12 E9/L (ref 0.05–0.50)
Hematocrit: 35 % — ABNORMAL LOW (ref 37.0–54.0)
Hemoglobin: 11.8 g/dL — ABNORMAL LOW (ref 12.5–16.5)
Immature Granulocytes #: 0.01 E9/L
Immature Granulocytes %: 0.2 % (ref 0.0–5.0)
Lymphocytes %: 47.5 % — ABNORMAL HIGH (ref 20.0–42.0)
Lymphocytes Absolute: 2.23 E9/L (ref 1.50–4.00)
MCH: 33.1 pg (ref 26.0–35.0)
MCHC: 33.7 % (ref 32.0–34.5)
MCV: 98.3 fL (ref 80.0–99.9)
MPV: 10.2 fL (ref 7.0–12.0)
Monocytes %: 7 % (ref 2.0–12.0)
Monocytes Absolute: 0.33 E9/L (ref 0.10–0.95)
Neutrophils %: 42.3 % — ABNORMAL LOW (ref 43.0–80.0)
Neutrophils Absolute: 1.98 E9/L (ref 1.80–7.30)
Platelets: 151 E9/L (ref 130–450)
RBC: 3.56 E12/L — ABNORMAL LOW (ref 3.80–5.80)
RDW: 13.3 fL (ref 11.5–15.0)
WBC: 4.7 E9/L (ref 4.5–11.5)

## 2018-03-12 LAB — URINE DRUG SCREEN
Amphetamine Screen, Urine: NOT DETECTED (ref <1000)
Barbiturate Screen, Ur: NOT DETECTED (ref <200)
Benzodiazepine Screen, Urine: POSITIVE — AB (ref <200)
Cannabinoid Scrn, Ur: POSITIVE — AB
Cocaine Metabolite Screen, Urine: NOT DETECTED (ref <300)
Methadone Screen, Urine: NOT DETECTED (ref <300)
Opiate Scrn, Ur: POSITIVE — AB
PCP Screen, Urine: NOT DETECTED (ref <25)
Propoxyphene Scrn, Ur: NOT DETECTED (ref <300)

## 2018-03-12 LAB — COMPREHENSIVE METABOLIC PANEL W/ REFLEX TO MG FOR LOW K
ALT: 14 U/L (ref 0–40)
AST: 20 U/L (ref 0–39)
Albumin: 4.2 g/dL (ref 3.5–5.2)
Alkaline Phosphatase: 71 U/L (ref 40–129)
Anion Gap: 8 mmol/L (ref 7–16)
BUN: 12 mg/dL (ref 6–20)
CO2: 26 mmol/L (ref 22–29)
Calcium: 8.9 mg/dL (ref 8.6–10.2)
Chloride: 107 mmol/L (ref 98–107)
Creatinine: 0.9 mg/dL (ref 0.7–1.2)
GFR African American: >60
GFR Non-African American: >60 mL/min/{1.73_m2} (ref >=60)
Glucose: 83 mg/dL (ref 74–99)
Potassium reflex Magnesium: 4.4 mmol/L (ref 3.5–5.0)
Sodium: 141 mmol/L (ref 132–146)
Total Bilirubin: <0.2 mg/dL (ref 0.0–1.2)
Total Protein: 6.9 g/dL (ref 6.4–8.3)

## 2018-03-12 LAB — TROPONIN: Troponin: <0.01 ng/mL (ref 0.00–0.03)

## 2018-03-12 MED ORDER — NORMAL SALINE FLUSH 0.9 % IV SOLN
0.9 % | INTRAVENOUS | Status: DC | PRN
Start: 2018-03-12 — End: 2018-03-12

## 2018-03-12 MED ORDER — CYCLOBENZAPRINE HCL 10 MG PO TABS
10 MG | Freq: Two times a day (BID) | ORAL | Status: DC | PRN
Start: 2018-03-12 — End: 2018-03-12

## 2018-03-12 MED ORDER — ONDANSETRON HCL 4 MG/2ML IJ SOLN
4 MG/2ML | Freq: Four times a day (QID) | INTRAMUSCULAR | Status: DC | PRN
Start: 2018-03-12 — End: 2018-03-12

## 2018-03-12 MED ORDER — HYDROCODONE-ACETAMINOPHEN 7.5-325 MG PO TABS
Freq: Four times a day (QID) | ORAL | Status: DC | PRN
Start: 2018-03-12 — End: 2018-03-12

## 2018-03-12 MED ORDER — MAGNESIUM HYDROXIDE 400 MG/5ML PO SUSP
400 MG/5ML | Freq: Every day | ORAL | Status: DC | PRN
Start: 2018-03-12 — End: 2018-03-12

## 2018-03-12 MED ORDER — OXYCODONE HCL 5 MG PO TABS
5 MG | Freq: Once | ORAL | Status: AC
Start: 2018-03-12 — End: 2018-03-12
  Administered 2018-03-12: 04:00:00 5 mg via ORAL

## 2018-03-12 MED ORDER — ENOXAPARIN SODIUM 40 MG/0.4ML SC SOLN
40 MG/0.4ML | Freq: Every day | SUBCUTANEOUS | Status: DC
Start: 2018-03-12 — End: 2018-03-12

## 2018-03-12 MED ORDER — MORPHINE SULFATE (PF) 2 MG/ML IV SOLN
2 MG/ML | Freq: Once | INTRAVENOUS | Status: AC
Start: 2018-03-12 — End: 2018-03-11
  Administered 2018-03-12: 03:00:00 8 mg via INTRAVENOUS

## 2018-03-12 MED ORDER — CYCLOBENZAPRINE HCL 10 MG PO TABS
10 MG | Freq: Three times a day (TID) | ORAL | Status: DC | PRN
Start: 2018-03-12 — End: 2018-03-12
  Administered 2018-03-12: 13:00:00 10 mg via ORAL

## 2018-03-12 MED ORDER — NORMAL SALINE FLUSH 0.9 % IV SOLN
0.9 % | Freq: Two times a day (BID) | INTRAVENOUS | Status: DC
Start: 2018-03-12 — End: 2018-03-12
  Administered 2018-03-12: 13:00:00 10 mL via INTRAVENOUS

## 2018-03-12 MED ORDER — OXYCODONE HCL 5 MG PO TABS
5 MG | ORAL | Status: DC | PRN
Start: 2018-03-12 — End: 2018-03-12
  Administered 2018-03-12 (×2): 5 mg via ORAL

## 2018-03-12 MED FILL — CYCLOBENZAPRINE HCL 10 MG PO TABS: 10 mg | ORAL | Qty: 1

## 2018-03-12 MED FILL — OXYCODONE HCL 5 MG PO TABS: 5 mg | ORAL | Qty: 1

## 2018-03-12 MED FILL — ENOXAPARIN SODIUM 40 MG/0.4ML SC SOLN: 40 MG/0.4ML | SUBCUTANEOUS | Qty: 0.4

## 2018-03-12 MED FILL — NORMAL SALINE FLUSH 0.9 % IV SOLN: 0.9 % | INTRAVENOUS | Qty: 10

## 2018-03-12 MED FILL — MORPHINE SULFATE 2 MG/ML IJ SOLN: 2 mg/mL | INTRAMUSCULAR | Qty: 4

## 2018-03-12 NOTE — Progress Notes (Signed)
Walked into patient room patient verbalized upset about "noone checking on me today". This nurse reiterated to patient that nurse and PCA has been in room multiple times throughout the day and each time nothing was needed. Patient stated "I can't get any help around here from anyone". Nurse again asked what she could do for patient. HE stated "my roommates cell phone has been going off this and everyone keeps walking by ignoring it". This nurse stated "I cannot touch or answer someone's personal cell phone without their consent and the staff on the floor are aware he is off the unit so no need to go over to the A side of the room" PAtient stated "you don't even know what you're talking about, get out, that's not my point, you just need to leave." Will continue to monitor.

## 2018-03-12 NOTE — Progress Notes (Signed)
Dr. Quentin Mulling notified of patient leaving AMA

## 2018-03-12 NOTE — H&P (Signed)
Hospital Medicine History & Physical      PCP: No primary care provider on file.    Date of Admission: 03/12/2018    Date of Service: 03-12-18    Chief Complaint:  Neck pain HA/arms/legs numbness rt eye blurry vision/seizures      History Of Present Illness:      32 yo male hx PTSD and seizure d/o seen on 03-10-18 for neck /back chest pain after a fall down 9 steps in ER , PANSCAN was negative and sent home w/c collar with flexeril/naproxen , came back to ER with dizziess rt eye blurry vision and arms/legs numbness HA - intermittent numbness and tingling down both arms and legs since being seen in the emergency department yesterday-his pain has been uncontrolled and intermittently when he moves his head to one side or the other he expresses numbness down his arms and legs. He had seizure which happens in his sleep, not on any meds, for seizures  Pt sent here for nsgy eval , accepted by dr Vista Lawman, ct head and ct cervical spine unremarkable,       Past Medical History:          Diagnosis Date   ??? Pain     right arm, amputation site   ??? 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   ??? Seizures (HCC)     last one 10/2014       Past Surgical History:          Procedure Laterality Date   ??? ARM AMPUTATION      Rt above elbow age 39   ??? FINGER FRACTURE SURGERY Left 03/24/2015    5th metacarpal open reduction internal fixation   ??? OTHER SURGICAL HISTORY Right 01/20/2015    revision humerus amputation       Medications Prior to Admission:      Prior to Admission medications    Medication Sig Start Date End Date Taking? Authorizing Provider   cyclobenzaprine (FLEXERIL) 5 MG tablet Take 1 tablet by mouth 2 times daily as needed for Muscle spasms 03/11/18 03/21/18  Janean Sark, DO   naproxen (NAPROSYN) 500 MG tablet Take 1 tablet by mouth 2 times daily for 7 days 03/11/18 03/18/18  Janean Sark, DO       Allergies:  Tylenol [acetaminophen]    Social History:      The patient currently  lives at home    TOBACCO:   reports that he has been smoking cigarettes.  He has been smoking about 1.00 pack per day. He has quit using smokeless tobacco.  ETOH:   reports that he drinks about 3.6 oz of alcohol per week.      Family History:      Reviewed in detail and negative for DM, CAD, Cancer, CVA. Positive as follows:    History reviewed. No pertinent family history.    REVIEW OF SYSTEMS:   Pertinent positives as noted in the HPI. All other systems reviewed and negative.    PHYSICAL EXAM PERFORMED:    BP (!) 105/53    Pulse 55    Temp 97.2 ??F (36.2 ??C) (Temporal)    Resp 14    SpO2 100%     General appearance:  No apparent distress, appears stated age and cooperative.  HEENT: c collar in place, neck tenderness   Neck: Supple, with full range of motion. No jugular venous distention. Trachea midline.  Respiratory:  Normal  respiratory effort. Clear to auscultation, bilaterally without Rales/Wheezes/Rhonchi.  Cardiovascular:  Regular rate and rhythm with normal S1/S2 without murmurs, rubs or gallops.  Abdomen: Soft, non-tender, non-distended with normal bowel sounds.  Musculoskeletal:  Rt arm prox amputee  Skin: Skin color, texture, turgor normal.  No rashes or lesions.  Neurologic:  Neurovascularly intact without any focal sensory/motor deficits. Cranial nerves: II-XII intact, grossly non-focal.  Psychiatric:  Alert and oriented, thought content appropriate, normal insight      Labs:     Recent Labs     03/10/18  2150 03/11/18  2158   WBC 4.6 4.7   HGB 11.9* 11.8*   HCT 35.5* 35.0*   PLT 153 151     Recent Labs     03/10/18  2150 03/11/18  2158   NA 143 141   K 3.7 4.4   CL 107 107   CO2 27 26   BUN 12 12   CREATININE 0.9 0.9   CALCIUM 9.2 8.9     Recent Labs     03/10/18  2150 03/11/18  2158   AST 24 20   ALT 17 14   BILITOT 0.2 <0.2   ALKPHOS 71 71     Recent Labs     03/10/18  2150   INR 1.0     Recent Labs     03/10/18  2150 03/11/18  2158   TROPONINI <0.01 <0.01       Urinalysis:      Lab Results    Component Value Date    NITRU Negative 11/09/2014    BLOODU Negative 11/09/2014    SPECGRAV <=1.005 11/09/2014    GLUCOSEU Negative 11/09/2014       Radiology:         No orders to display       ASSESSMENT:    Active Hospital Problems    Diagnosis Date Noted   ??? Paresthesia [R20.2] 03/12/2018       32 yo male hx PTSD and seizure d/o seen on 03-10-18 for neck /back chest pain after a fall down 9 steps in ER , PANSCAN was negative and sent home w/c collar with flexeril/naproxen , came back to ER with dizziess rt eye blurry vision and arms/legs numbness HA - intermittent numbness and tingling down both arms and legs since being seen in the emergency department yesterday-his pain has been uncontrolled and intermittently when he moves his head to one side or the other he expresses numbness down his arms and legs. He had seizure which happens in his sleep, not on any meds, for seizures  Pt sent here for nsgy eval , accepted by dr Vista Lawman, ct head and ct cervical spine unremarkable,    FALL  PARESTHESIA  NECK PAIN/HA  SEIZURES  RT EYE BLURRY VISION    Observation  NSGY c/s  Pain control  Keep c- collar  Neuro c/s- pt req  UDS      DVT Prophylaxis: scd  Diet: DIET GENERAL;  Code Status: Full Code    PT/OT Eval Status: ordered    Dispo - obs       Adria Costley, MD    Thank you No primary care provider on file. for the opportunity to be involved in this patient's care. If you have any questions or concerns please feel free to contact me at [phone].

## 2018-03-12 NOTE — Progress Notes (Signed)
Dr. Army Melia stopped nurse I the hall and stated patient would not let him examine him and asked about his day. Nurse told Doctor about patient's behaviors throughout the day.   Patient put on call light and stated he wanted to leave.Nurse walked in room, with patient dressed and wants to leave. Nurse updated patient on plan of care of the MRI cervical spine and his pain medication time due.   PAtient states "I'm going to another hospital you arent' even caring or doing anything for my pain, I'm going somewhere else" patient's girlfriend stated "they have a plan for you" and he states "no I'm leaving no one is doing anything for me" This nurse asked patient what was needed and patient looks at his girlfriend and states "you aren't even doing anything either. IV removed.   PAtient signed AMA paper.   This nurse seen Dr. Vista Lawman in the hallway and updated on situation.  Patient stormed out of room and down the hallway.

## 2018-03-12 NOTE — Discharge Summary (Signed)
Hospital Medicine Discharge Summary    Patient ID: [name] [name]      Patient's PCP: No primary care provider on file.    Admit Date: 03/12/2018     Discharge Date: 03/12/2018      Admitting Physician: [name], MD     Discharge Physician: [name], MD     Discharge Diagnoses:       Active Hospital Problems    Diagnosis Date Noted   ??? Paresthesia [R20.2] 03/12/2018       The patient was seen and examined on day of discharge and this discharge summary is in conjunction with any daily progress note from day of discharge.    Hospital Course:     PLEASE SEE H/P done earlier today  Pt left AMA same day later

## 2018-03-12 NOTE — Progress Notes (Signed)
Neurosurgery Note.    I went to see the patient. He was leaving AMA at the time. MRI C spine had been ordered.     He is 32 years old. Hx of PTSD, seizures, right above elbow amputation. He had a fall a few days ago.     CT C spine 5/13- no fx or subluxation.    It was reported that he was having dizziness with head movements and headache, and neck pain.     MRI C spine was ordered to rule of ligamentous injury. However, he has left AMA.     Electronically signed by Derry Lory, MD on 03/12/2018 at 3:48 PM

## 2018-03-12 NOTE — ED Notes (Signed)
@  4782 PATIENT GOING TO  8 SOUTH ROOM [phone], PHONE NUMBER FOR REPORT  [phone]     [name] [name]  03/12/18 [phone]

## 2018-03-12 NOTE — ED Notes (Signed)
Patient states that he has seizures in his sleep and he has learned to control them.  Patient takes no medications for these.      Blythe Stanford, RN  03/12/18 [phone]

## 2018-03-12 NOTE — Consults (Signed)
Neurology was consulted for seizures    Past Medical History:     Past Medical History:   Diagnosis Date   ??? Pain     right arm, amputation site   ??? 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   ??? Seizures (HCC)     last one 10/2014       Past Surgical History:     Past Surgical History:   Procedure Laterality Date   ??? ARM AMPUTATION      Rt above elbow age 32   ??? FINGER FRACTURE SURGERY Left 03/24/2015    5th metacarpal open reduction internal fixation   ??? OTHER SURGICAL HISTORY Right 01/20/2015    revision humerus amputation       Allergies:     Tylenol [acetaminophen]    Medications:     Prior to Admission medications    Medication Sig Start Date End Date Taking? Authorizing Provider   cyclobenzaprine (FLEXERIL) 5 MG tablet Take 1 tablet by mouth 2 times daily as needed for Muscle spasms 03/11/18 03/21/18  Janean Sark, DO   naproxen (NAPROSYN) 500 MG tablet Take 1 tablet by mouth 2 times daily for 7 days 03/11/18 03/18/18  Janean Sark, DO       Social History:     Denies ETOH, tobacco, or illicit drugs    Review of Systems:      SOB with pain  + dizziness with head movements  No loss of consciousness  + falls  No incontinence of bowels or bladder  No itching or bruising appreciated  + numbness, tingling on arms   No weakness of extremities    ROS is otherwise negative     Family History:     History reviewed. No pertinent family history.     History of Present Illness:     32 year old male with PMH of bipolar disorders, seizures presented to the [facility] ER on 5/13 with worsening headache and neck pain. He was also c/o intermittent numbness and tingling down both arms especially with movement of the head. He also reported to the ER that he had a seizure on the day of presentation. He is not on any meds for seizures.     He was in the ER 2 days ago after he had a fall from 9 step stairs while he was helping the laundry and lost his footing. He developed neck, chest and back pan  after that fall. His CT imaging of the lumbar spine, head, thoracic spine, chest and neck were unremarkable and he was discharged to home with pain control after that encounter.     Repeat CT head and Ct cervical spine unremarkable in the ER. CBC, CMP, troponin negative.     Dr. Vista Lawman, neurosurgery consulted from the [facility] ER and receommended admission for further evaluation. He advised the patient to be transferred to [facility] for Neurology and neurosurgery evaluation.     Patient requested to be seen by neurology. He describes he "seizure" episodes like having shaking movements all over his body, aggravated by stressful episodes especially with memories of his childhood trauma. He remembers the details of the "seizure". The fiancee states most of the epsides take place at night time while he is sleeping. No urinary incontinence or tongue biting noted. Postictal confusion is sometime present however since most episodes take place at night; it is hard to assess for the fiancee. The last episode happened on  the day of ER presentation while he was sitting on the couch. He developed shaking all over his body. He took his head between his arms to prevent his head to hit somewhere during the episode.       His blurry vision also started after the fall. He descries that as bilateral and no focal deficits noted with vision.     Objective:     BP 98/62    Pulse 52    Temp 98.2 ??F (36.8 ??C) (Temporal)    Resp 14    SpO2 100%     General appearance: alert, appears stated age, cooperative and mild-moderate distress due to pain  Head: normocephalic  Eyes: conjunctivae/corneas clear. .  Neck: no adenopathy, no carotid bruit, supple, symmetrical, trachea midline and thyroid not enlarged, symmetric, no tenderness/mass/nodules  Lungs: clear to auscultation bilaterally  Heart: regular rate and rhythm, S1, S2 normal, no murmur, click, rub or gallop  Extremities: right arm with amputation above elbow, no cyanosis or edema  Pulses: 2+ and  symmetric  Skin: color, texture, turgor normal---no rashes or lesions    Mental Status: alert, oriented, thought content appropriate    Appropriate attention/concentration  Intact fundus of knowledge  Memories intact    Speech: no dysarthria  Language: no aphasias    Cranial Nerves:  I: smell    II: visual acuity     II: visual fields Full    II: pupils PERL   III,VII: ptosis None   III,IV,VI: extraocular muscles  EOMI without nystagmus    V: mastication Normal   V: facial light touch sensation  Normal   V,VII: corneal reflex  Present   VII: facial muscle function - upper  Normal   VII: facial muscle function - lower Normal   VIII: hearing Normal   IX: soft palate elevation  Normal   IX,X: gag reflex Present   XI: trapezius strength  5/5   XI: sternocleidomastoid strength 5/5   XI: neck extension strength  5/5   XII: tongue strength  Normal     Motor:             Left Grip  5/5               Right Bicep  4/5           Left Triceps  4/5          Right Deltoid  4/5     Left Deltoid  4/5         Right Quadriceps  5/5          Left Quadriceps    5/5           Right Gastrocnemius    5/5    Left Gastrocnemius   5/5  Right Ant Tibialis   5/5  Left Ant Tibialis   5/5   5/5 throughout  Normal bulk and tone   No drift  No abnormal movements    Sensory:  LT and PP normal  Vibration normal     Coordination:   FNF normal    Gait:  Not checked due to pain    DTR:     Left Brachioradialis reflex 2+  Left Biceps reflex 2+  Left Triceps reflex 2+  Right Quadriceps reflex 2+  Left Quadriceps reflex 2+  Right Achilles reflex 2+  Left Achilles reflex 2+    No Babinskis  No Hoffman's     No other pathological reflexes    Laboratory/Radiology:  CBC:   Lab Results   Component Value Date    WBC 4.7 03/11/2018    RBC 3.56 03/11/2018    HGB 11.8 03/11/2018    HCT 35.0 03/11/2018    MCV 98.3 03/11/2018    MCH 33.1 03/11/2018    MCHC 33.7 03/11/2018    RDW 13.3 03/11/2018    PLT 151 03/11/2018    MPV 10.2 03/11/2018     BMP:    Lab Results    Component Value Date    NA 141 03/11/2018    K 4.4 03/11/2018    CL 107 03/11/2018    CO2 26 03/11/2018    BUN 12 03/11/2018    LABALBU 4.2 03/11/2018    CREATININE 0.9 03/11/2018    CALCIUM 8.9 03/11/2018    GFRAA >60 03/11/2018    LABGLOM >60 03/11/2018    GLUCOSE 83 03/11/2018       I independently reviewed the labs and imaging studies today    Assessment:     Panic attacks; seizure is very low probability with the history  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    Plan:     Will defer further imaging options to NSGY  No need to start any seizure meds  Will obtain EEG   Possible consideration of MRI    Will follow    Discussed with Dr. Army Melia.     Baldomero Lamy, MD  9:52 AM  03/12/2018     The girlfriend describes "seizure" DVTs were he locally shakes side-to-side at nights mostly but also present during the day with eyes open;. Lately these episodes happen more frequently.  He'll lose his consciousness and doesn't know his whereabouts. He doesn't get any warning before that. Per the girlfriend sometimes he moans but doesn't respond.    She cannot describe the events happening at night more accurately because she says that she didn't see exactly what was going on. However she works during the day and just recently saw this episodes.     He had a fall followed by dizziness and headache, neck pain and bilateral shoulder pain and the pain in the arm. 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 This happen while he was a child.  He also complains of bilateral leg weakness. But more importantly he complains of pain. He didn't let me examine him. He said that he was in pain. He refused EEG earlier.    During my conversation with him he aggravated, verbally abusive and ask for discharge paper to go to another hospital. Several attempts to calm him down was futile.    Phill Mutter, MD

## 2018-03-12 NOTE — ED Notes (Signed)
[facility] here for patient.     Blythe Stanford, RN  03/12/18 [phone]

## 2018-03-12 NOTE — Progress Notes (Signed)
Patient stated he had not voided since last night before coming to ED.This nurse bladder scanned him and showed 560cc. Walked patient to the bathroom with stand by assist and urinal. Patient voided 510cc of urine.     Urine drug screen collected even though 2 doses of OXY were given.

## 2018-03-12 NOTE — ED Notes (Signed)
Patient resting in room, neck pain remains.  Hard collar replaced with soft collar for comfort purposes.     Blythe Stanford, RN  03/12/18 [phone]

## 2018-03-12 NOTE — Progress Notes (Signed)
EEG not completed due to pt comfort level, per pt being in "excrutiating pain". Pt was noncompliant and was unwilling to let technicians touch him to preform test. Pt being sent back to room and is aware that test cannot be done.    [employer] 03/12/18

## 2018-03-12 NOTE — Progress Notes (Signed)
Paged neuro at this time for new consult request.  Dr. Army Melia is covering.

## 2018-03-16 LAB — OPIATE, QUANTITATIVE, URINE
6AM Urine: <10 ng/mL
Codeine, Urine: <20 ng/mL
Hydrocodone, Urine: <20 ng/mL
Hydromorphone, Urine: <20 ng/mL
Morphine Urine: 815 ng/mL
Norhydrocodone, Urine: <20 ng/mL
Noroxycodone, Urine: 2534 ng/mL
Noroxymorphone, Urine: 220 ng/mL
Oxycodone, Urine Confirmation: 854 ng/mL
Oxymorphone, Urine: 22 ng/mL

## 2018-03-16 LAB — BENZODIAZEPINE, QUANTITATIVE, URINE
7-Aminoclonazepam, Urine: <5 ng/mL
Alpha-Hydroxyalprazolam, Urine: 111 ng/mL
Alpha-Hydroxymidazolam, Urine: <20 ng/mL
Alprazolam, Urine: 23 ng/mL
Chlordiazepoxide, Urine: <20 ng/mL
Clonazepam, Urine: <5 ng/mL
Diazepam, Urine: <20 ng/mL
Lorazepam, Urine: <20 ng/mL
Midazolam, Urine: <20 ng/mL
Nordiazepam, Urine: <20 ng/mL
Oxazepam, Urine: 198 ng/mL
Temazepam, Urine: 40 ng/mL

## 2018-03-17 LAB — CANNABINOID, URINE, CONFIRMATION: Cannabinoids Conf Ur: >500 ng/mL

## 2018-04-18 ENCOUNTER — Emergency Department: Admit: 2018-04-18 | Payer: PRIVATE HEALTH INSURANCE

## 2018-04-18 ENCOUNTER — Inpatient Hospital Stay
Admit: 2018-04-18 | Discharge: 2018-04-18 | Disposition: A | Discharge status: Home / Self Care | Payer: PRIVATE HEALTH INSURANCE | Attending: Emergency Medicine

## 2018-04-18 DIAGNOSIS — S60222A Contusion of left hand, initial encounter: Secondary | ICD-10-CM

## 2018-04-18 MED ORDER — IBUPROFEN 400 MG PO TABS
400 MG | Freq: Once | ORAL | Status: AC
Start: 2018-04-18 — End: 2018-04-18
  Administered 2018-04-18: 06:00:00 400 mg via ORAL

## 2018-04-18 MED FILL — IBUPROFEN 400 MG PO TABS: 400 mg | ORAL | Qty: 1

## 2018-04-18 NOTE — ED Notes (Signed)
Xray will transport patient to xray after she finishes up portable CXR's in ED.     Julian Hy, RN  04/18/18 [phone]

## 2018-04-18 NOTE — ED Provider Notes (Signed)
[facility]      Department of Emergency Medicine   ED  Provider Note  Admit Date/RoomTime: 04/18/2018  1:21 AM  ED Room: 07/07  Chief Complaint   Hand Injury (left hand, fell two days ago.  )    History of Present Illness   Source of history provided by:  patient and spouse/SO.  History/Exam Limitations: none.      [name] [name] is a 32 y.o. old male presenting to the emergency department by private vehicle, for Left hand pain which occured 2 day(s) prior to arrival.  Cause of complaint: fall while at home.  There has been a history of no prior problems with this area in the past.   He is left handed. The patients tetanus status is unknown.   Since onset the symptoms have been moderate in degree.  His pain is aggraveated by movement, use and palpation and relieved by nothing.     ROS    Pertinent positives and negatives are stated within HPI, all other systems reviewed and are negative.    Past Surgical History:  has a past surgical history that includes Arm Amputation; other surgical history (Right, 01/20/2015); and Finger fracture surgery (Left, 03/24/2015).  Social History:  reports that he has been smoking cigarettes.  He has been smoking about 1.00 pack per day. He has quit using smokeless tobacco. He reports that he drinks about 3.6 oz of alcohol per week. He reports that he has current or past drug history. Drug: Marijuana.  Family History: family history is not on file.  Allergies: Tylenol [acetaminophen]    Physical Exam           ED Triage Vitals [04/18/18 0015]   BP Temp Temp Source Pulse Resp SpO2 Height Weight   (!) 133/55 98.4 F (36.9 C) Oral 64 18 98 % 6\' 2"  (1.88 m) 154 lb 4 oz (70 kg)      Oxygen Saturation Interpretation: Normal.    Constitutional:  Alert, development consistent with age.  Neck:  Normal ROM.  Supple. Non-tender.  Hand: Left dorsal 5th mid aspect  Metacarpal .              Tenderness: moderate.              Swelling: Mild.             Deformity: no.               Skin:  no  erythema, rash or wounds noted.       Neurovascular:              Motor deficit: none.              Sensory deficit:   none.              Pulse deficit: none.              Capillary refill: normal.  Fingers:  all            Tenderness:  none.              Swelling: None.            Deformity: no.              ROM: full range of motion.            Skin:  no erythema, rash or wounds noted.   Wrist:  Tenderness:  none.              Swelling: None.            Deformity: no.             ROM: full range of motion.             Skin:  no erythema, rash or wounds noted.    Lymphatics: No lymphangitis or adenopathy noted.  Neurological:  Oriented.  Motor functions intact.t.    Lab / Imaging Results   (All laboratory and radiology results have been personally reviewed by myself)  Labs:  No results found for this visit on 04/18/18.  Imaging:  All Radiology results interpreted by Radiologist unless otherwise noted.  XR HAND LEFT (MIN 3 VIEWS)   Final Result   No acute fracture        ED Course / Medical Decision Making     Medications   ibuprofen (ADVIL;MOTRIN) tablet 400 mg (400 mg Oral Given 04/18/18 0201)        MDM:    Films were obtained and are negative for hardware dysfunction or acute fracture. Plan is subsequently for symptom control, limited use and  immobilization with appropriate outpatient follow-up.    Counseling:   The emergency provider has spoken with the patient and discussed today's results, in addition to providing specific details for the plan of care and counseling regarding the diagnosis and prognosis.  Questions are answered at this time and they are agreeable with the plan.  Assessment      1. Contusion of left hand, initial encounter      Plan   Discharge to home  Patient condition is good    New Medications     Discharge Medication List as of 04/18/2018  3:49 AM        Electronically signed by Silas Sacramento, APRN - CNP   DD: 04/18/18  **This report was transcribed using voice recognition  software. Every effort was made to ensure accuracy; however, inadvertent computerized transcription errors may be present.  END OF ED PROVIDER NOTE     Casandra Doffing, APRN - CNP  04/26/18 1604

## 2018-04-18 NOTE — ED Notes (Signed)
Patient left without signing his discharge paper.     Julian Hy, RN  04/18/18 [phone]

## 2018-04-18 NOTE — ED Notes (Signed)
Ice pack applied to left hand.     Julian Hy, RN  04/18/18 [phone]

## 2018-06-14 ENCOUNTER — Inpatient Hospital Stay
Admit: 2018-06-14 | Discharge: 2018-06-15 | Disposition: A | Discharge status: Home / Self Care | Payer: PRIVATE HEALTH INSURANCE | Attending: Emergency Medicine

## 2018-06-14 ENCOUNTER — Emergency Department: Admit: 2018-06-15 | Payer: PRIVATE HEALTH INSURANCE

## 2018-06-14 DIAGNOSIS — Z008 Encounter for other general examination: Secondary | ICD-10-CM
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
## 2018-06-14 NOTE — ED Notes (Signed)
CO paperwork faxed to staffing office with soft copy placed in chart.        Yvetta Coderawnya Ziyan Schoon, RN  06/14/18 2030

## 2018-06-14 NOTE — ED Provider Notes (Signed)
States that unknown group of people have threatening to take his life. Patient notes that he was increasingly anxious today and attempted to take his own life. [place]. Notes that he slipped and rolled down embankment. Left hand, right arm, and low back pain. Denies trauma to head or loc. History of PTSD, does not see counselor/psychiatrist and not currently prescribed medications. Previous attempt 10 years prior for suicide attempt by gun. Patient hospitalized at that time.  Patient denies audio or visual hallucinations.  Denies homicidal ideations.          Review of Systems   Constitutional: Negative for chills, diaphoresis, fatigue and fever.   HENT: Negative for congestion, ear pain, rhinorrhea, sinus pressure, sneezing, sore throat and trouble swallowing.    Respiratory: Negative for cough, chest tightness, shortness of breath and wheezing.    Cardiovascular: Negative for chest pain and palpitations.   Gastrointestinal: Negative for abdominal distention, abdominal pain, blood in stool, constipation, diarrhea, nausea and vomiting.   Genitourinary: Negative for difficulty urinating, dysuria, flank pain, hematuria and urgency.   Musculoskeletal: Negative for arthralgias, back pain, myalgias and neck pain.   Skin: Negative for rash and wound.   Neurological: Negative for weakness, light-headedness, numbness and headaches.   Psychiatric/Behavioral: Positive for agitation and suicidal ideas. Negative for confusion. The patient is nervous/anxious. The patient is not hyperactive.        Physical Exam   Constitutional: He is oriented to person, place, and time. He appears well-developed and well-nourished. No distress.   HENT:   Head: Normocephalic and atraumatic.   Right Ear: External ear normal.   Left Ear: External ear normal.   Mouth/Throat: Oropharynx is clear and moist.   No signs of trauma to the head.  Pupils equally round and reactive to light and accommodation.  No overlying  ecchymosis to the mastoid region or hemotympanum.   Eyes: Pupils are equal, round, and reactive to light. Conjunctivae and EOM are normal. Right eye exhibits no discharge. Left eye exhibits no discharge.   Neck: Normal range of motion. Neck supple.   No cervical spinous process tenderness to palpation, step-off, or deformity.  No signs of trauma to the cervical spine.  Full active and passive ROM to the neck.   Cardiovascular: Normal rate, regular rhythm and normal heart sounds.   Pulmonary/Chest: Effort normal and breath sounds normal. No respiratory distress. He has no wheezes. He has no rales. He exhibits no tenderness.   Abdominal: Soft. He exhibits no distension and no mass. There is no tenderness. There is no rebound and no guarding.   Musculoskeletal: Normal range of motion. He exhibits no tenderness.   Neurological: He is alert and oriented to person, place, and time.   Skin: Skin is warm and dry. No rash noted. He is not diaphoretic.   Psychiatric: Judgment normal. His mood appears anxious. He is agitated. He expresses suicidal ideation. He expresses no homicidal ideation. He expresses suicidal plans. He expresses no homicidal plans.   Patient crying on exam noted active suicidal thoughts and plan.       Procedures    MDM  Number of Diagnoses or Management Options  Encounter for psychiatric assessment:   Suicidal intent:   Diagnosis management comments: Patient is a 32 year old male who presented to ED for evaluation of suicidal attempt. On arrival to ED, physical exam significant for left hand and wrist pain with palpation, no obvious step-off or deformity and no overlying ecchymosis  or abrasions noted to the hand, thorax, head or neck.  Patient was pink slipped, and suicidal precautions taken. Labs reviewed-- (+)cocaine and cannabinoids, otherwise unremarkable.  Imaging with no acute osseous abnormalities and no acute intra-abdominal, cardiopulmonary, or intracranial abnormalities.  Patient medically  cleared for psychiatric evaluation.        NEXUS Criteria for C-Spine Imaging  Focal Neurologic Deficit Present? no   Midline Spinal Tenderness Present? no   Altered Level of Consciousness Present?  no   Intoxication Present? no   Distracting Injury Present? no       10:48 PM--patient is medically cleared for psychiatric evaluation.    --------------------------------------------- PAST HISTORY ---------------------------------------------  Past Medical History:  has a past medical history of Pain, PTSD (post-traumatic stress disorder), and Seizures (HCC).    Past Surgical History:  has a past surgical history that includes Arm Amputation; other surgical history (Right, 01/20/2015); and Finger fracture surgery (Left, 03/24/2015).    Social History:  reports that he has been smoking cigarettes. He has been smoking about 1.00 pack per day. He has quit using smokeless tobacco. He reports that he drinks about 6.0 standard drinks of alcohol per week. He reports that he has current or past drug history. Drug: Marijuana.    Family History: family history is not on file.     The patient???s home medications have been reviewed.    Allergies: Tylenol [acetaminophen]    -------------------------------------------------- RESULTS -------------------------------------------------    LABS:  Results for orders placed or performed during the hospital encounter of 06/14/18   CBC auto differential   Result Value Ref Range    WBC 5.3 4.5 - 11.5 E9/L    RBC 3.76 (L) 3.80 - 5.80 E12/L    Hemoglobin 12.5 12.5 - 16.5 g/dL    Hematocrit 81.1 (L) 37.0 - 54.0 %    MCV 97.9 80.0 - 99.9 fL    MCH 33.2 26.0 - 35.0 pg    MCHC 34.0 32.0 - 34.5 %    RDW 13.2 11.5 - 15.0 fL    Platelets 194 130 - 450 E9/L    MPV 9.4 7.0 - 12.0 fL    Neutrophils % 51.6 43.0 - 80.0 %    Immature Granulocytes % 0.0 0.0 - 5.0 %    Lymphocytes % 40.9 20.0 - 42.0 %    Monocytes % 6.0 2.0 - 12.0 %    Eosinophils % 1.1 0.0 - 6.0 %    Basophils % 0.4 0.0 - 2.0 %    Neutrophils #  2.75 1.80 - 7.30 E9/L    Immature Granulocytes # 0.00 E9/L    Lymphocytes # 2.18 1.50 - 4.00 E9/L    Monocytes # 0.32 0.10 - 0.95 E9/L    Eosinophils # 0.06 0.05 - 0.50 E9/L    Basophils # 0.02 0.00 - 0.20 E9/L   Basic metabolic panel   Result Value Ref Range    Sodium 141 132 - 146 mmol/L    Potassium 4.0 3.5 - 5.0 mmol/L    Chloride 106 98 - 107 mmol/L    CO2 28 22 - 29 mmol/L    Anion Gap 7 7 - 16 mmol/L    Glucose 122 (H) 74 - 99 mg/dL    BUN 14 6 - 20 mg/dL    CREATININE 1.1 0.7 - 1.2 mg/dL    GFR Non-African American >60 >=60 mL/min/1.73    GFR African American >60     Calcium 9.2 8.6 - 10.2 mg/dL  Serum Drug Screen   Result Value Ref Range    Ethanol Lvl <10 mg/dL    Acetaminophen Level <5.0 (L) 10.0 - 30.0 mcg/mL    Salicylate, Serum <0.3 0.0 - 30.0 mg/dL    TCA Scrn NEGATIVE [id number]:045Cutoff:300 ng/mL   Urine Drug Screen   Result Value Ref Range    Drug Screen Comment: see below        RADIOLOGY:  CT ABDOMEN PELVIS W IV CONTRAST Additional Contrast? None   Final Result   Short segment of jejuno- jejunal intussusception. No other significant   abnormal findings.                     CT CHEST W CONTRAST   Final Result      1. Tiny pleural-based nodule in the right upper lobe which is likely   benign.   2. Linear densities foreign body at the right chest wall which may   represent a needle or wire fragment. See above.                  CT Head WO Contrast   Final Result   No significant abnormal findings.      XR WRIST RIGHT (2 VIEWS)   Final Result   No evidence of fracture.      XR HUMERUS RIGHT (MIN 2 VIEWS)   Final Result   No acute fracture or dislocation. Previous amputation is noted.      XR WRIST LEFT (MIN 3 VIEWS)   Final Result   In the frontal projection, there is a questionable scaphoid waist   fracture. Coned down ulnar deviation view is recommended.      XR HAND LEFT (MIN 3 VIEWS)   Final Result   No acute fracture or dislocation. Stable postoperative changes as   noted. See above.         ------------------------- NURSING NOTES AND VITALS REVIEWED ---------------------------  Date / Time Roomed:  06/14/2018  7:10 PM  ED Bed Assignment:  11/11    The nursing notes within the ED encounter and vital signs as below have been reviewed.     Patient Vitals for the past 24 hrs:   BP Temp Temp src Pulse Resp SpO2 Height Weight   06/14/18 1911 104/63 98.2 ??F (36.8 ??C) Temporal 98 18 97 % 6\' 2"  (1.88 m) 150 lb (68 kg)       Oxygen Saturation Interpretation: Normal    ------------------------------------------ PROGRESS NOTES ------------------------------------------  Re-evaluation(s):  Time: 10:49 PM  Patient???s symptoms show no change  Repeat physical examination is not changed    Counseling:  I have spoken with the patient and discussed today???s results, in addition to providing specific details for the plan of care and counseling regarding the diagnosis and prognosis.  Their questions are answered at this time and they are agreeable with the plan of admission.    --------------------------------- ADDITIONAL PROVIDER NOTES ---------------------------------  Consultations:  Time: 10:49 PM. Spoke with Social work.  Discussed case.  They will admit the patient.  This patient's ED course included: a personal history and physicial examination, re-evaluation prior to disposition, multiple bedside re-evaluations, cardiac monitoring and continuous pulse oximetry    This patient has remained hemodynamically stable during their ED course.    Diagnosis:  1. Suicidal intent    2. Encounter for psychiatric assessment        Disposition:  Patient's disposition: Admit to mental health unit - medically cleared for admission  Patient's condition is stable.           Lester CarolinaMatthew Harlie Buening, DO  Resident  06/14/18 [phone]

## 2018-06-14 NOTE — ED Notes (Signed)
Patient changed into psychiatric attire. Belongings collected and locked in cabinet in room. Patient provided a urinal.      Delora FuelMark Sabrinia Prien, RN  06/14/18 1920

## 2018-06-14 NOTE — ED Notes (Signed)
[facility] rf [facility] - [place] with [name].     [facility] transport to be called once pt accepted.      [name] [name] F [name] [name]  06/14/18 2318

## 2018-06-14 NOTE — ED Notes (Signed)
Bed: 11  Expected date:   Expected time:   Means of arrival:   Comments:  ems     Ranell Patricklizabeth A Brizza Nathanson, RN  06/14/18 1910

## 2018-06-15 LAB — CBC WITH AUTO DIFFERENTIAL
Basophils %: 0.4 % (ref 0.0–2.0)
Basophils Absolute: 0.02 E9/L (ref 0.00–0.20)
Eosinophils %: 1.1 % (ref 0.0–6.0)
Eosinophils Absolute: 0.06 E9/L (ref 0.05–0.50)
Hematocrit: 36.8 % — ABNORMAL LOW (ref 37.0–54.0)
Hemoglobin: 12.5 g/dL (ref 12.5–16.5)
Immature Granulocytes #: 0 E9/L
Immature Granulocytes %: 0 % (ref 0.0–5.0)
Lymphocytes %: 40.9 % (ref 20.0–42.0)
Lymphocytes Absolute: 2.18 E9/L (ref 1.50–4.00)
MCH: 33.2 pg (ref 26.0–35.0)
MCHC: 34 % (ref 32.0–34.5)
MCV: 97.9 fL (ref 80.0–99.9)
MPV: 9.4 fL (ref 7.0–12.0)
Monocytes %: 6 % (ref 2.0–12.0)
Monocytes Absolute: 0.32 E9/L (ref 0.10–0.95)
Neutrophils %: 51.6 % (ref 43.0–80.0)
Neutrophils Absolute: 2.75 E9/L (ref 1.80–7.30)
Platelets: 194 E9/L (ref 130–450)
RBC: 3.76 E12/L — ABNORMAL LOW (ref 3.80–5.80)
RDW: 13.2 fL (ref 11.5–15.0)
WBC: 5.3 E9/L (ref 4.5–11.5)

## 2018-06-15 LAB — EKG 12-LEAD
Atrial Rate: 59 {beats}/min
P Axis: 63 degrees
P-R Interval: 144 ms
Q-T Interval: 418 ms
QRS Duration: 80 ms
QTc Calculation (Bazett): 413 ms
R Axis: 88 degrees
T Axis: 80 degrees
Ventricular Rate: 59 {beats}/min

## 2018-06-15 LAB — URINE DRUG SCREEN
Amphetamine Screen, Urine: NOT DETECTED (ref <1000)
Barbiturate Screen, Ur: NOT DETECTED (ref <200)
Benzodiazepine Screen, Urine: NOT DETECTED (ref <200)
Cannabinoid Scrn, Ur: POSITIVE — AB
Cocaine Metabolite Screen, Urine: POSITIVE — AB (ref <300)
Methadone Screen, Urine: NOT DETECTED (ref <300)
Opiate Scrn, Ur: NOT DETECTED
PCP Screen, Urine: NOT DETECTED (ref <25)
Propoxyphene Scrn, Ur: NOT DETECTED (ref <300)

## 2018-06-15 LAB — BASIC METABOLIC PANEL
Anion Gap: 7 mmol/L (ref 7–16)
BUN: 14 mg/dL (ref 6–20)
CO2: 28 mmol/L (ref 22–29)
Calcium: 9.2 mg/dL (ref 8.6–10.2)
Chloride: 106 mmol/L (ref 98–107)
Creatinine: 1.1 mg/dL (ref 0.7–1.2)
GFR African American: >60
GFR Non-African American: >60 mL/min/{1.73_m2} (ref >=60)
Glucose: 122 mg/dL — ABNORMAL HIGH (ref 74–99)
Potassium: 4 mmol/L (ref 3.5–5.0)
Sodium: 141 mmol/L (ref 132–146)

## 2018-06-15 LAB — SERUM DRUG SCREEN
Acetaminophen Level: <5 ug/mL — ABNORMAL LOW (ref 10.0–30.0)
Ethanol Lvl: <10 mg/dL
Salicylate, Serum: <0.3 mg/dL (ref 0.0–30.0)
TCA Scrn: NEGATIVE ng/mL

## 2018-06-15 MED ORDER — IOPAMIDOL 76 % IV SOLN
76 % | Freq: Once | INTRAVENOUS | Status: AC | PRN
Start: 2018-06-15 — End: 2018-06-14
  Administered 2018-06-15: 01:00:00 110 mL via INTRAVENOUS

## 2018-06-15 MED ORDER — NORMAL SALINE FLUSH 0.9 % IV SOLN
0.9 % | Freq: Once | INTRAVENOUS | Status: AC
Start: 2018-06-15 — End: 2018-06-14
  Administered 2018-06-15: 01:00:00 10 mL via INTRAVENOUS

## 2018-06-15 MED ORDER — LORAZEPAM 2 MG/ML IJ SOLN
2 MG/ML | Freq: Once | INTRAMUSCULAR | Status: AC
Start: 2018-06-15 — End: 2018-06-14
  Administered 2018-06-15: 04:00:00 1 mg via INTRAVENOUS

## 2018-06-15 MED FILL — LORAZEPAM 2 MG/ML IJ SOLN: 2 mg/mL | INTRAMUSCULAR | Qty: 1

## 2018-06-15 NOTE — ED Notes (Signed)
Pt report called to [facility]. Advised having to set up transport with [facility]. Will advise ETA ASAP     Basil DessChristine J Valla Pacey, RN  06/15/18 [phone]

## 2018-06-15 NOTE — ED Notes (Signed)
[facility] called back. Will search for transportation     Basil DessChristine J Rishawn Walck, RN  06/15/18 [phone]

## 2018-06-15 NOTE — ED Notes (Signed)
Call placed to [facility]. Awaiting call back     Basil Dess, RN  06/15/18 [phone]

## 2018-06-15 NOTE — ED Notes (Signed)
Pt picked up [facility] for transport     Basil Dess, RN  06/15/18 [phone]

## 2018-06-15 NOTE — ED Notes (Signed)
Pt accepted to [facility] by Dr Shellee Milo room 110[address]A. Pink slip faxed over.     Basil Dess, RN  06/15/18 [phone]

## 2018-06-20 LAB — COCAINE, URINE, CONFIRMATION: Cocaine, Confirm, Urine: >1000 ng/mL

## 2018-06-20 LAB — CANNABINOID, URINE, CONFIRMATION: Cannabinoids Conf Ur: >500 ng/mL

## 2018-07-23 ENCOUNTER — Emergency Department: Admit: 2018-07-24 | Payer: PRIVATE HEALTH INSURANCE

## 2018-07-23 ENCOUNTER — Inpatient Hospital Stay
Admit: 2018-07-23 | Discharge: 2018-07-24 | Disposition: A | Discharge status: Home / Self Care | Payer: PRIVATE HEALTH INSURANCE

## 2018-07-23 DIAGNOSIS — S60042A Contusion of left ring finger without damage to nail, initial encounter: Secondary | ICD-10-CM

## 2018-07-23 NOTE — ED Provider Notes (Signed)
[facility]   HPI:  07/23/18, Time: 11:18 PM         [name] [name] is a 32 y.o. male presenting to the ED for him pain.  Patient reports that yesterday his dog and another dog started fighting he states that he was punching the dog to get off of his own dog.  Patient does have a left hand swelling primarily to the dorsal aspect.  He denies being bit there is no injury there is no puncture there is no bite mark.  Patient is concerned he may have broken his hand.  He reports taking Motrin earlier in the day without relief.  Denies any numbness or tingling.  Does have previous fracture back from 2016 and does have pins in place also also concerned regarding these pins.    Review of Systems:   Pertinent positives and negatives are stated within HPI, all other systems reviewed and are negative.          --------------------------------------------- PAST HISTORY ---------------------------------------------  Past Medical History:  has a past medical history of Pain, PTSD (post-traumatic stress disorder), and Seizures (HCC).    Past Surgical History:  has a past surgical history that includes Arm Amputation; other surgical history (Right, 01/20/2015); and Finger fracture surgery (Left, 03/24/2015).    Social History:  reports that he has been smoking cigarettes. He has been smoking about 1.00 pack per day. He has quit using smokeless tobacco. He reports that he drinks about 6.0 standard drinks of alcohol per week. He reports that he has current or past drug history. Drug: Marijuana.    Family History: family history is not on file.     The patient???s home medications have been reviewed.    Allergies: Tylenol [acetaminophen]    -------------------------------------------------- RESULTS -------------------------------------------------  All laboratory and radiology results have been personally reviewed by myself   LABS:  No results found for this visit on 07/23/18.    RADIOLOGY:  Interpreted by Radiologist.  XR HAND LEFT (MIN 3  VIEWS)   Final Result      No evidence of acute fracture.                      ------------------------- NURSING NOTES AND VITALS REVIEWED ---------------------------   The nursing notes within the ED encounter and vital signs as below have been reviewed.   BP 122/65    Pulse 84    Temp 98.8 ??F (37.1 ??C)    Resp 16    Ht 6\' 2"  (1.88 m)    Wt 185 lb (83.9 kg)    SpO2 96%    BMI 23.75 kg/m??   Oxygen Saturation Interpretation: Normal      ---------------------------------------------------PHYSICAL EXAM--------------------------------------      Constitutional/General: Alert and oriented x3, well appearing, non toxic in NAD  Head: Normocephalic and atraumatic  Eyes: PERRL, EOMI  Mouth: Oropharynx clear, handling secretions, no trismus  Neck: Supple, full ROM,   Pulmonary: Lungs clear to auscultation bilaterally, no wheezes, rales, or rhonchi. Not in respiratory distress  Cardiovascular:  Regular rate and rhythm, no murmurs, gallops, or rubs. 2+ distal pulses  Abdomen: Soft, non tender, non distended,   Extremities: Moves all extremities x 4. Warm and well perfused, patient with notable soft tissue swelling to dorsum of left hand primarily over the third fourth and fifth metacarpal interphalangeal joints.  Left radial pulse strong at 2+.  Grasp 5 out of 5.  Pronation and supination intact, full sensations intact.  No ecchymosis  no erythema noted.  Skin: warm and dry without rash  Neurologic: GCS 15,  Psych: Normal Affect      ------------------------------ ED COURSE/MEDICAL DECISION MAKING----------------------  Medications   ibuprofen (ADVIL;MOTRIN) tablet 800 mg (800 mg Oral Given 07/23/18 2019)         ED COURSE:       Medical Decision Making: Plan will be for imaging we will provide patient with Motrin as well as ice.  X-ray of the left hand negative for any acute fracture.  Patient with hand contusion.  Ace wrap applied.  Patient will be discharged home he was educated on rest, ice, compression as well as good  follow-up care with his primary care physician.  Patient is neurovascular intact.  Patient expressed understanding and safely discharged home.       Counseling:   The emergency provider has spoken with the patient and discussed today???s results, in addition to providing specific details for the plan of care and counseling regarding the diagnosis and prognosis.  Questions are answered at this time and they are agreeable with the plan.      --------------------------------- IMPRESSION AND DISPOSITION ---------------------------------    IMPRESSION  1. Contusion of right hand, initial encounter        DISPOSITION  Disposition: Discharge to home  Patient condition is good      NOTE: This report was transcribed using voice recognition software. Every effort was made to ensure accuracy; however, inadvertent computerized transcription errors may be present     Ellery PlunkHeather Bayless, APRN - CNP  07/24/18 0406  ATTENDING PROVIDER ATTESTATION:     Supervising Physician, on-site, available for consultation, non-participatory in the evaluation or care of this patient       Dwain SarnaYazan K Averill Winters, MD  07/24/18 [phone]

## 2018-07-24 MED ORDER — AMOXICILLIN-POT CLAVULANATE 875-125 MG PO TABS
875-125 MG | Freq: Once | ORAL | Status: DC
Start: 2018-07-24 — End: 2018-07-23

## 2018-07-24 MED ORDER — AMOXICILLIN-POT CLAVULANATE 875-125 MG PO TABS
875-125 MG | ORAL_TABLET | Freq: Two times a day (BID) | ORAL | 0 refills | Status: AC
Start: 2018-07-24 — End: 2018-07-30

## 2018-07-24 MED ORDER — IBUPROFEN 800 MG PO TABS
800 MG | Freq: Once | ORAL | Status: AC
Start: 2018-07-24 — End: 2018-07-23
  Administered 2018-07-24: 800 mg via ORAL

## 2018-07-24 MED ORDER — IBUPROFEN 800 MG PO TABS
800 MG | ORAL_TABLET | Freq: Three times a day (TID) | ORAL | 0 refills | Status: DC | PRN
Start: 2018-07-24 — End: 2019-05-12

## 2018-07-24 MED FILL — IBUPROFEN 800 MG PO TABS: 800 mg | ORAL | Qty: 1

## 2019-05-12 ENCOUNTER — Inpatient Hospital Stay
Admit: 2019-05-12 | Discharge: 2019-05-12 | Disposition: A | Discharge status: Home / Self Care | Payer: PRIVATE HEALTH INSURANCE

## 2019-05-12 ENCOUNTER — Emergency Department: Admit: 2019-05-12 | Payer: PRIVATE HEALTH INSURANCE

## 2019-05-12 DIAGNOSIS — M25522 Pain in left elbow: Secondary | ICD-10-CM

## 2019-05-12 MED ORDER — IBUPROFEN 800 MG PO TABS
800 | ORAL_TABLET | Freq: Three times a day (TID) | ORAL | 1 refills | 13.00000 days | Status: AC | PRN
Start: 2019-05-12 — End: ?

## 2019-05-12 NOTE — ED Notes (Signed)
Pt to xray at this time     Nicola Girt, RN  05/12/19 1340

## 2019-05-12 NOTE — ED Provider Notes (Signed)
[facility]     Department of Emergency Medicine   ED  Provider Note  Admit Date/RoomTime: 05/12/2019 12:27 PM  ED Room: 49/ST-4    Chief Complaint     Elbow Pain (left elbow/wrist pain. pt had a recent fall)    History of Present Illness   Source of history provided by:  patient.  History/Exam Limitations: none       [name] [name] is a 33 y.o. old male who has a past medical history of:   Past Medical History:   Diagnosis Date   . Pain     right arm, amputation site   . 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   . Seizures (HCC)     last one 10/2014   presents to the emergency department by private vehicle, for Left hand, elbow and shoulder pain which occured 1 day(s) prior to arrival.  Cause of complaint: fall while at home.  The symptoms are associated with pain.  There has been a history of no prior problems with this area in the past.   He is left handed. The patients tetanus status is up to date.   Since onset the symptoms have been mild in degree.  His pain is aggraveated by movement, use and palpation and relieved by nothing.  Patient is a right below elbow arm amputation.  He states he lost his footing and fell directly onto his left arm yesterday.  He denies any type of head injury or loss of consciousness.  Patient denies any type of anticoagulation use.    ROS   Pertinent positives and negatives are stated within HPI, all other systems reviewed and are negative.       Past Surgical History:   Procedure Laterality Date   . ARM AMPUTATION      Rt above elbow age 42   . FINGER FRACTURE SURGERY Left 03/24/2015    5th metacarpal open reduction internal fixation   . OTHER SURGICAL HISTORY Right 01/20/2015    revision humerus amputation   Social History:  reports that he has been smoking cigarettes. He has been smoking about 1.00 pack per day. He has quit using smokeless tobacco. He reports current alcohol use of about 6.0 standard drinks of alcohol per week. He  reports current drug use. Drug: Marijuana.  Family History: family history is not on file.   Allergies: Tylenol [acetaminophen]    Physical Exam           ED Triage Vitals   BP Temp Temp Source Pulse Resp SpO2 Height Weight   05/12/19 1240 05/12/19 1226 05/12/19 1240 05/12/19 1226 05/12/19 1240 05/12/19 1226 -- --   (!) 118/59 98 F (36.7 C) Tympanic 90 16 95 %        Oxygen Saturation Interpretation: Normal.    Constitutional:  Alert, development consistent with age.  Neck:  Normal ROM.  Supple. Non-tender.  Elbow: Left entire elbow.              Tenderness:  moderate.              Swelling: None.             Deformity: no.              ROM: full range with pain.             Skin:  no erythema, rash or wounds noted.  Neurovascular             Motor deficit: none.              Sensory deficit: no.              Pulse deficit: no.              Capillary refill: normal.  Shoulder:              Tenderness:  mild.              Swelling: None.            Deformity: no.             ROM: full range of motion.             Skin:  no erythema, rash or wounds noted.   Wrist:               Tenderness:  mild.              Swelling: None.            Deformity: no.             ROM: full range of motion.            Skin:  no erythema, rash or wounds noted.  Lymphatics: No lymphangitis or adenopathy noted.  Neurological:  Oriented.  Motor functions intact.    Lab / Imaging Results   (All laboratory and radiology results have been personally reviewed by myself)  Labs:  No results found for this visit on 05/12/19.  Imaging:  All Radiology results interpreted by Radiologist unless otherwise noted.  XR WRIST LEFT (MIN 3 VIEWS)   Final Result      1. No evidence of acute fracture or dislocation.   2. Old healed fractures at the fourth and fifth metacarpals. There is   a bent fixation screw at the fifth metacarpal.      XR SHOULDER LEFT (MIN 2 VIEWS)   Final Result      1. No evidence of acute fracture or dislocation.   2. Old  healed fractures at the fourth and fifth metacarpals. There is   a bent fixation screw at the fifth metacarpal.      XR HAND LEFT (MIN 3 VIEWS)   Final Result      1. No evidence of acute fracture or dislocation.   2. Old healed fractures at the fourth and fifth metacarpals. There is   a bent fixation screw at the fifth metacarpal.      XR ELBOW LEFT (2 VIEWS)   Final Result      1. No evidence of acute fracture or dislocation.   2. Old healed fractures at the fourth and fifth metacarpals. There is   a bent fixation screw at the fifth metacarpal.        ED Course / Medical Decision Making   Medications - No data to display     Re-examination:  05/12/19       Time:        Consult(s):   None    Procedure(s):   none    MDM:   Films were obtained based on moderate suspicion for bony injury as per history/physical findings . Plan is subsequently for symptom control, limited use and  immobilization with appropriate outpatient follow-up.  Patient's x-rays of his left hand, wrist, elbow and shoulder showed no evidence for any type of  acute fractures or dislocations.  The x-ray of his hand did show his pins in place and his old fractures were noted.  Patient be discharged at this time as follows primary care provider.  He was given 800 mg ibuprofen to go home with.    Counseling:    The emergency provider has spoken with the patient and discussed today's results, in addition to providing specific details for the plan of care and counseling regarding the diagnosis and prognosis.  Questions are answered at this time and they are agreeable with the plan to be discharged.    Assessment      1. Pain of left upper extremity      Plan   Discharge to home  Patient condition is good    New Medications     Discharge Medication List as of 05/12/2019  2:21 PM      START taking these medications    Details   ibuprofen (IBU) 800 MG tablet Take 1 tablet by mouth every 8 hours as needed for Pain Take with food., Disp-30 tablet,R-1Print            Electronically signed by Randal Buba, APRN - NP   DD: 05/12/19  **This report was transcribed using voice recognition software. Every effort was made to ensure accuracy; however, inadvertent computerized transcription errors may be present.  END OF ED PROVIDER NOTE      Marcelline Mates, APRN - NP  05/12/19 1450

## 2019-10-26 ENCOUNTER — Inpatient Hospital Stay
Admission: AD | Admit: 2019-10-26 | Payer: Self-pay | Source: Other Acute Inpatient Hospital | Admitting: Internal Medicine

## 2019-10-26 ENCOUNTER — Encounter (HOSPITAL_COMMUNITY): Payer: Self-pay | Admitting: Emergency Medicine

## 2019-10-26 ENCOUNTER — Other Ambulatory Visit: Payer: Self-pay

## 2019-10-26 ENCOUNTER — Inpatient Hospital Stay (HOSPITAL_COMMUNITY)
Admission: EM | Admit: 2019-10-26 | Discharge: 2019-10-27 | DRG: 443 | Discharge status: Against Medical Advice | Payer: Medicaid Other | Attending: Family Medicine | Admitting: Family Medicine

## 2019-10-26 DIAGNOSIS — R10811 Right upper quadrant abdominal tenderness: Secondary | ICD-10-CM | POA: Diagnosis not present

## 2019-10-26 DIAGNOSIS — B179 Acute viral hepatitis, unspecified: Secondary | ICD-10-CM | POA: Diagnosis not present

## 2019-10-26 DIAGNOSIS — Z20828 Contact with and (suspected) exposure to other viral communicable diseases: Secondary | ICD-10-CM | POA: Diagnosis present

## 2019-10-26 DIAGNOSIS — F17210 Nicotine dependence, cigarettes, uncomplicated: Secondary | ICD-10-CM | POA: Diagnosis present

## 2019-10-26 DIAGNOSIS — R1084 Generalized abdominal pain: Secondary | ICD-10-CM

## 2019-10-26 DIAGNOSIS — B159 Hepatitis A without hepatic coma: Secondary | ICD-10-CM | POA: Diagnosis present

## 2019-10-26 DIAGNOSIS — R7401 Elevation of levels of liver transaminase levels: Secondary | ICD-10-CM

## 2019-10-26 LAB — RESPIRATORY PANEL BY RT PCR (FLU A&B, COVID)
Influenza A by PCR: NEGATIVE
Influenza B by PCR: NEGATIVE
SARS Coronavirus 2 by RT PCR: NEGATIVE

## 2019-10-26 LAB — CBC
HCT: 35.8 % — ABNORMAL LOW (ref 39.0–52.0)
Hemoglobin: 11.9 g/dL — ABNORMAL LOW (ref 13.0–17.0)
MCH: 31.6 pg (ref 26.0–34.0)
MCHC: 33.2 g/dL (ref 30.0–36.0)
MCV: 95 fL (ref 80.0–100.0)
Platelets: 236 10*3/uL (ref 150–400)
RBC: 3.77 MIL/uL — ABNORMAL LOW (ref 4.22–5.81)
RDW: 14.5 % (ref 11.5–15.5)
WBC: 3.3 10*3/uL — ABNORMAL LOW (ref 4.0–10.5)
nRBC: 0 % (ref 0.0–0.2)

## 2019-10-26 LAB — LACTIC ACID, PLASMA: Lactic Acid, Venous: 1.7 mmol/L (ref 0.5–1.9)

## 2019-10-26 LAB — URINALYSIS, ROUTINE W REFLEX MICROSCOPIC
Glucose, UA: NEGATIVE mg/dL
Hgb urine dipstick: NEGATIVE
Ketones, ur: NEGATIVE mg/dL
Leukocytes,Ua: NEGATIVE
Nitrite: NEGATIVE
Protein, ur: NEGATIVE mg/dL
Specific Gravity, Urine: 1.018 (ref 1.005–1.030)
pH: 6 (ref 5.0–8.0)

## 2019-10-26 LAB — BILIRUBIN, FRACTIONATED(TOT/DIR/INDIR)
Bilirubin, Direct: 3.6 mg/dL — ABNORMAL HIGH (ref 0.0–0.2)
Indirect Bilirubin: 2 mg/dL — ABNORMAL HIGH (ref 0.3–0.9)
Total Bilirubin: 5.6 mg/dL — ABNORMAL HIGH (ref 0.3–1.2)

## 2019-10-26 LAB — COMPREHENSIVE METABOLIC PANEL
ALT: 1469 U/L — ABNORMAL HIGH (ref 0–44)
AST: 813 U/L — ABNORMAL HIGH (ref 15–41)
Albumin: 2.8 g/dL — ABNORMAL LOW (ref 3.5–5.0)
Alkaline Phosphatase: 203 U/L — ABNORMAL HIGH (ref 38–126)
Anion gap: 7 (ref 5–15)
BUN: 6 mg/dL (ref 6–20)
CO2: 26 mmol/L (ref 22–32)
Calcium: 8.4 mg/dL — ABNORMAL LOW (ref 8.9–10.3)
Chloride: 102 mmol/L (ref 98–111)
Creatinine, Ser: 0.82 mg/dL (ref 0.61–1.24)
GFR calc Af Amer: >60 mL/min (ref >60)
GFR calc non Af Amer: >60 mL/min (ref >60)
Glucose, Bld: 113 mg/dL — ABNORMAL HIGH (ref 70–99)
Potassium: 3.5 mmol/L (ref 3.5–5.1)
Sodium: 135 mmol/L (ref 135–145)
Total Bilirubin: 5.8 mg/dL — ABNORMAL HIGH (ref 0.3–1.2)
Total Protein: 6.7 g/dL (ref 6.5–8.1)

## 2019-10-26 LAB — ACETAMINOPHEN LEVEL: Acetaminophen (Tylenol), Serum: <10 ug/mL — ABNORMAL LOW (ref 10–30)

## 2019-10-26 LAB — LACTATE DEHYDROGENASE: LDH: 320 U/L — ABNORMAL HIGH (ref 98–192)

## 2019-10-26 LAB — LIPASE, BLOOD: Lipase: 31 U/L (ref 11–51)

## 2019-10-26 LAB — PROTIME-INR
INR: 1.1 (ref 0.8–1.2)
Prothrombin Time: 14 seconds (ref 11.4–15.2)

## 2019-10-26 LAB — GAMMA GT: GGT: 93 U/L — ABNORMAL HIGH (ref 7–50)

## 2019-10-26 MED ORDER — FENTANYL CITRATE (PF) 100 MCG/2ML IJ SOLN
100.0000 ug | Freq: Once | INTRAMUSCULAR | Status: AC
  Administered 2019-10-26: 100 ug via INTRAVENOUS
  Filled 2019-10-26: qty 2

## 2019-10-26 MED ORDER — SODIUM CHLORIDE 0.9 % IV BOLUS
1000.0000 mL | Freq: Once | INTRAVENOUS | Status: AC
  Administered 2019-10-26: 1000 mL via INTRAVENOUS

## 2019-10-26 MED ORDER — ONDANSETRON HCL 4 MG/2ML IJ SOLN
4.0000 mg | Freq: Once | INTRAMUSCULAR | Status: AC
  Administered 2019-10-26: 18:00:00 4 mg via INTRAVENOUS
  Filled 2019-10-26: qty 2

## 2019-10-26 MED ORDER — POLYETHYLENE GLYCOL 3350 17 G PO PACK: 17.0000 g | PACK | Freq: Every day | ORAL | Status: DC | PRN

## 2019-10-26 MED ORDER — SODIUM CHLORIDE 0.9 % IV SOLN: INTRAVENOUS | Status: DC

## 2019-10-26 MED ORDER — SODIUM CHLORIDE 0.9% FLUSH
3.0000 mL | Freq: Once | INTRAVENOUS | Status: AC
  Administered 2019-10-26: 18:00:00 3 mL via INTRAVENOUS

## 2019-10-26 MED ORDER — PROMETHAZINE HCL 25 MG PO TABS: 12.5000 mg | ORAL_TABLET | Freq: Four times a day (QID) | ORAL | Status: DC | PRN

## 2019-10-26 MED ORDER — ENOXAPARIN SODIUM 40 MG/0.4ML ~~LOC~~ SOLN
40.0000 mg | SUBCUTANEOUS | Status: DC
  Filled 2019-10-26: qty 0.4

## 2019-10-26 MED ORDER — OXYCODONE HCL 5 MG PO TABS
5.0000 mg | ORAL_TABLET | ORAL | Status: DC | PRN
  Administered 2019-10-26 – 2019-10-27 (×2): 5 mg via ORAL
  Filled 2019-10-26 (×2): qty 1

## 2019-10-26 NOTE — H&P (Addendum)
[facility] Admission History and Physical Service Pager: [phone]  Patient name: Harold Gonzales Medical record number: [id number] Date of birth: October 04, 1986 Age: 33 y.o. Gender: male  Primary Care Provider: Patient, No Pcp Per Consultants: GI Code Status: DNI, confirmed with patient in the ED Emergency contact: Wilson Singer, [phone]  Chief Complaint: abdominal pan and elevated LFT's  Assessment and Plan: Harold Gonzales is a 33 y.o. male presenting with abdominal pain and elevated LFTs.  No significant past medical history.  Abdominal pain and elevated LFTs 2/2 hepatitis a: Patient with 4 to 5 days of abdominal pain with nausea and vomiting.  Patient with RUQ and suprapubic pain on exam. Vital signs stable.  CT abdomen pelvis with diffuse periportal edema and hepatic enhancement which could be seen with hepatitis. Gallbladder was edematously thickened and some pericholecystic inflammation.  Diffuse colonic wall thickening from the transverse and sigmoid colon. Could reflect colitis of infectious or inflammatory etiology.  Bladder wall thickening noted.  RUQ performed at [facility] showed gallbladder wall thickening with pericholecystic fluid and no sonographic Murphy sign. In the ED, GI consulted who did not recommend consulting general surgery as they believe that this is likely more liver pathology. Hepatitis panel collected. Hepatitis A positive in the ED which is likely cause of RUQ pain as well as findings on CT. Could also have acalculous cholecystitis given his findings. However patient also noted to have suprapubic pain. UA pending from ED as patient has not been able to give urine sample, does have bladder thickening from CT that could likely be from acute cystitis.  Also with colonic wall thickening on transverse and sigmoid colon that could reflect colitis and patient is tender, however denies any constipation, diarrhea or blood in stool. Could be  reactive from hepatitis A infection, but given that he has not had any diarrhea or constipation may require further work-up at a later time. -Admit to telemetry, attending Dr. Gwendlyn Deutscher -GI consulted in the ED, appreciate recommendations -Supportive care with IVF, give 1L bolus now and then mIVF -Monitor CMP, add-on LDH, GGT, fractionated bili -Soft diet with phenergan -Vitals per routine -Follow-up UA -Follow-up blood culture and urine culture  Syncope Patient reports that he passed out in his hot shower 4-5 times over the past 4 to 5 days.  He states that he did not hit his head at any time.  He reports that when he was in the shower in order to feel better from his nausea and vomiting that he then he felt dizzy and passed out.  He reports he is never had any history of passing out before.  He denies any chest pain.  He denies any palpitations.  Patient believes it was due to him not eating for 4 to 5 days.  Likely due to dehydration and vasovagal. Patient does have history of smoking but no other risk factors. -Obtain EKG -Cardiac monitoring for at least 24 hours -Monitor for further symptoms -1 L bolus and then 150 mL/h IVF  FEN/GI: IVF as above, trial soft diet Prophylaxis: Lovenox  Disposition: Admit to telemetry  History of Present Illness:  Harold Gonzales is a 33 y.o. male presenting with 4 to 5 days abdominal pain with nausea and vomiting.  Patient reports that his pain is located mainly in the right upper quadrant but also has a bandlike distribution across lower quadrant.  He states he has never had this before.  Patient reported to [facility] earlier today where he had  elevated LFTs and increased bili.  He then left AMA from the hospital so that he could be evaluated at a different hospital where he has access to GI follow-up.  Patient reports he has not had any sick contacts.  He denies any diarrhea.  He denies any fever.  He reports that he has never had alcohol.  He denies any heavy  Tylenol use.  Patient reports that he has not had any recent travel.  He reports that he is not on any medications for any other reasons.  Patient reports that he has not been able to keep anything down food or drink wise.  He states that his urine has been brown and he has had some itching as well as pain with urination.  He also denies any blood in his stool, dark stools, blood in vomit.  In the ED, patient with tenderness on exam.  Reports that he received some fluids at [facility].  ED provider did request records from [facility] ED stay.  Labs in the ED with normal lipase, AST 13, ALT 1469, alk phos 2003, total bili 5.8 and WBC 3.3. Also discussed with GI, Dr. Paulita Fujita, who felt that the clinical picture did not represent acute cholecystitis but rather acute hepatitis of unclear etiology.  He recommended medical admission with work-up including hepatitis panel which was ordered in the ED.  Review Of Systems: Per HPI with the following additions:   Review of Systems  Constitutional: Negative for chills and fever.  HENT: Negative for congestion and sore throat.   Eyes: Negative for blurred vision and double vision.  Respiratory: Positive for shortness of breath. Negative for cough and sputum production.   Cardiovascular: Negative for chest pain and leg swelling.  Gastrointestinal: Positive for abdominal pain, nausea and vomiting. Negative for blood in stool, constipation, diarrhea and melena.  Genitourinary: Positive for dysuria. Negative for frequency, hematuria and urgency.  Musculoskeletal: Negative for back pain and myalgias.  Skin: Negative for rash.  Neurological: Positive for dizziness and weakness.    Patient Active Problem List   Diagnosis Date Noted  . Transaminitis 10/26/2019    Past Medical History: History reviewed. No pertinent past medical history.  Past Surgical History: History reviewed. No pertinent surgical history.  Social History: Social History   Tobacco Use  .  Smoking status: Current Every Day Smoker  . Smokeless tobacco: Never Used  Substance Use Topics  . Alcohol use: Never  . Drug use: Never   Additional social history: Patient lives with brother, reports that he smokes a half a pack per day.  Denies any alcohol use.  Denies any illicit drug use. Please also refer to relevant sections of EMR.  Family History: No family history on file.   Allergies and Medications: No Known Allergies No current facility-administered medications on file prior to encounter.   Current Outpatient Medications on File Prior to Encounter  Medication Sig Dispense Refill  . acetaminophen (TYLENOL) 500 MG tablet Take 1,000 mg by mouth every 6 (six) hours as needed for headache (pain).    . Aspirin-Acetaminophen-Caffeine (GOODY HEADACHE PO) Take 1 packet by mouth daily as needed (pain/headache).      Objective: BP (!) 99/57 (BP Location: Left Arm)   Pulse 76   Temp 98.8 F (37.1 C) (Oral)   Resp 17   Ht _0  (1.88 m)   Wt 79.4 kg   SpO2 100%   BMI 22.47 kg/m  Exam: General: NAD, pleasant Cardiovascular: RRR, no m/r/g, no LE edema  Respiratory: CTA BL, normal work of breathing Gastrointestinal: soft, tender in RUQ and suprapubic, no rebound tenderness, negative Murphy's, nondistended, normoactive BS MSK: moves 4 extremities equally Derm: no rashes appreciated Neuro: CN II-XII grossly intact Psych: AOx3, appropriate affect  Labs and Imaging: CBC BMET  Recent Labs  Lab 10/26/19 1553  WBC 3.3*  HGB 11.9*  HCT 35.8*  PLT 236   Recent Labs  Lab 10/26/19 1553  NA 135  K 3.5  CL 102  CO2 26  BUN 6  CREATININE 0.82  GLUCOSE 113*  CALCIUM 8.4*     Imaging from [place]: [place] RUQ: Gallbladder wall thickening with pericholecystic fluid. No sonographic Murphy sign or gallstones are noted. equivocal for acute cholecystitis. CT abd/pelvis:There is diffuse periportal edema but with uniform hepatic enhancement and a smooth hepatic surface contour.  Findings nonspecific, however can be seen with hepatitis. The gallbladder wall appears edematously thickened with some mucosal hyperemia and mild pericholecystic inflammation. Diffuse colonic wall thickening from the transverse to sigmoid colon. Features could reflect a colitis of infectious or inflammatory etiology. Circumferential bladder wall thickening. Finding is more than expected for underdistention. Age advanced aortic atherosclerosis.   Harold Gonzales, [place], DO 10/26/2019, 7:51 PM PGY-3, [place] Intern pager: [phone], text pages welcome

## 2019-10-26 NOTE — ED Provider Notes (Signed)
[facility] EMERGENCY DEPARTMENT Provider Note   CSN: [id number] Arrival date & time: 10/26/19  1526     History Chief Complaint  Patient presents with  . Abdominal Pain    Harold Gonzales is a 33 y.o. male.  33 year old male who presents with abdominal pain.  Patient reports that he has had 4 to 5 days of persistent, severe abdominal pain associated with nausea and vomiting.  He has not been able to eat or drink.  Pain is worse if he tries to eat.  He denies any associated diarrhea, fevers, or blood in his stool.  He presented to [facility] yesterday evening where he was found to have abnormal liver enzymes and gallbladder problems.  He was told that he would need GI evaluation and they did not have GI specialist there.  He left AMA and came here for further evaluation.  He notes that he had an ultrasound and CT scan while at the other hospital.  He denies any cough/cold symptoms, recent travel, or sick contacts.  Denies IV drug use, alcohol problems, or heavy Tylenol use.  The history is provided by the patient.  Abdominal Pain      History reviewed. No pertinent past medical history.  There are no problems to display for this patient.   History reviewed. No pertinent surgical history.     No family history on file.  Social History   Tobacco Use  . Smoking status: Current Every Day Smoker  . Smokeless tobacco: Never Used  Substance Use Topics  . Alcohol use: Never  . Drug use: Never    Home Medications Prior to Admission medications   Medication Sig Start Date End Date Taking? Authorizing Provider  azithromycin (ZITHROMAX Z-PAK) 250 MG tablet As directed 07/29/12   Elyn Peers, MD    Allergies    Tramadol  Review of Systems   Review of Systems  Gastrointestinal: Positive for abdominal pain.   All other systems reviewed and are negative except that which was mentioned in HPI  Physical Exam Updated Vital Signs BP 111/64 (BP  Location: Left Arm)   Pulse 90   Temp 99 F (37.2 C) (Oral)   Resp 16   Ht _0  (1.88 m)   Wt 79.4 kg   SpO2 99%   BMI 22.47 kg/m   Physical Exam Vitals and nursing note reviewed.  Constitutional:      General: He is not in acute distress.    Appearance: He is well-developed.     Comments: Pale, ill appearing, non-toxic  HENT:     Head: Normocephalic and atraumatic.  Eyes:     General: Scleral icterus present.     Conjunctiva/sclera: Conjunctivae normal.  Cardiovascular:     Rate and Rhythm: Normal rate and regular rhythm.     Heart sounds: Normal heart sounds. No murmur.  Pulmonary:     Effort: Pulmonary effort is normal.     Breath sounds: Normal breath sounds.  Abdominal:     General: Bowel sounds are normal. There is no distension.     Palpations: Abdomen is soft.     Tenderness: There is abdominal tenderness in the right upper quadrant, right lower quadrant and suprapubic area.  Musculoskeletal:     Cervical back: Neck supple.     Comments: R arm amputation  Skin:    General: Skin is warm and dry.  Neurological:     Mental Status: He is alert and oriented to person, place, and  time.     Comments: Fluent speech, normal gait  Psychiatric:        Judgment: Judgment normal.     ED Results / Procedures / Treatments   Labs (all labs ordered are listed, but only abnormal results are displayed) Labs Reviewed  COMPREHENSIVE METABOLIC PANEL - Abnormal; Notable for the following components:      Result Value   Glucose, Bld 113 (*)    Calcium 8.4 (*)    Albumin 2.8 (*)    AST 813 (*)    ALT 1,469 (*)    Alkaline Phosphatase 203 (*)    Total Bilirubin 5.8 (*)    All other components within normal limits  CBC - Abnormal; Notable for the following components:   WBC 3.3 (*)    RBC 3.77 (*)    Hemoglobin 11.9 (*)    HCT 35.8 (*)    All other components within normal limits  CULTURE, BLOOD (ROUTINE X 2)  CULTURE, BLOOD (ROUTINE X 2)  RESPIRATORY PANEL BY RT PCR  (FLU A&B, COVID)  LIPASE, BLOOD  URINALYSIS, ROUTINE W REFLEX MICROSCOPIC  LACTIC ACID, PLASMA  ACETAMINOPHEN LEVEL  HEPATITIS PANEL, ACUTE  PROTIME-INR    EKG None  Radiology No results found.  Procedures Procedures (including critical care time)  Medications Ordered in ED Medications  sodium chloride flush (NS) 0.9 % injection 3 mL (3 mLs Intravenous Given 10/26/19 1756)  fentaNYL (SUBLIMAZE) injection 100 mcg (100 mcg Intravenous Given 10/26/19 1756)  ondansetron (ZOFRAN) injection 4 mg (4 mg Intravenous Given 10/26/19 1756)    ED Course  I have reviewed the triage vital signs and the nursing notes.  Pertinent labs & imaging results that were available during my care of the patient were reviewed by me and considered in my medical decision making (see chart for details).    MDM Rules/Calculators/A&P                      Normal VS, ambulatory on arrival. RUQ and lower abd tenderness on exam. I reviewed OSH imaging which shows:  [place] RUQ:  Gallbladder wall thickening with pericholecystic fluid. No sonographic Murphy sign or gallstones are noted. The findings are equivocal for acute cholecystitis.  CT abd/pelvis: IMPRESSION: 1. There is diffuse periportal edema but with uniform hepatic enhancement and a smooth hepatic surface contour. Findings are nonspecific, however can be seen with hepatitis.  2. The gallbladder wall appears edematously thickened with some mucosal hyperemia and mild pericholecystic inflammation. Consider further evaluation with right upper quadrant ultrasound. 3. Diffuse colonic wall thickening from the transverse to sigmoid colon. Features could reflect a colitis of infectious or inflammatory etiology. 4. Circumferential bladder wall thickening. Finding is slightly greater than expected for underdistention. Correlate with urinary symptoms and consider urinalysis to exclude cystitis. 5. Age advanced aortic atherosclerosis. Aortic  Atherosclerosis  Labs show normal lipase, AST 813, ALT 1469, Alk phos 203, tbili 5.8, WBC 3.3, normal platelets. Discussed w/ GI, Dr. Paulita Fujita, who felt that clinical picture likely did not represent acute cholecystitis but rather acute hepatitis of unclear etiology. He recommended medical w/u including hepatitis panel which is ordered. Discussed admission w/ Family medicine teaching service, Dr. Enid Derry and they will admit for further care. COVID-19 and flu tests pending.    Harold Gonzales was evaluated in Emergency Department on 10/26/2019 for the symptoms described in the history of present illness. He was evaluated in the context of the global COVID-19 pandemic, which necessitated consideration that the  patient might be at risk for infection with the SARS-CoV-2 virus that causes COVID-19. Institutional protocols and algorithms that pertain to the evaluation of patients at risk for COVID-19 are in a state of rapid change based on information released by regulatory bodies including the CDC and federal and state organizations. These policies and algorithms were followed during the patient's care in the ED.  Final Clinical Impression(s) / ED Diagnoses Final diagnoses:  Acute hepatitis    Rx / DC Orders ED Discharge Orders    None       Javeria Briski, Wenda Over[place], MD 10/26/19 1843

## 2019-10-26 NOTE — ED Notes (Signed)
Pt made aware that a urine sample is needed. Pt. Stated he is unable to go at this time.

## 2019-10-26 NOTE — ED Notes (Signed)
Admitting at bedside 

## 2019-10-26 NOTE — ED Triage Notes (Signed)
C/o sharp lower abd pain x 4-5 days with vomiting.  States he was seen at [facility] and told to come to [facility] ED for gallbladder surgery and liver failure.

## 2019-10-27 DIAGNOSIS — B179 Acute viral hepatitis, unspecified: Secondary | ICD-10-CM

## 2019-10-27 LAB — COMPREHENSIVE METABOLIC PANEL
ALT: 1226 U/L — ABNORMAL HIGH (ref 0–44)
AST: 673 U/L — ABNORMAL HIGH (ref 15–41)
Albumin: 2.6 g/dL — ABNORMAL LOW (ref 3.5–5.0)
Alkaline Phosphatase: 185 U/L — ABNORMAL HIGH (ref 38–126)
Anion gap: 7 (ref 5–15)
BUN: 5 mg/dL — ABNORMAL LOW (ref 6–20)
CO2: 27 mmol/L (ref 22–32)
Calcium: 8.3 mg/dL — ABNORMAL LOW (ref 8.9–10.3)
Chloride: 103 mmol/L (ref 98–111)
Creatinine, Ser: 0.8 mg/dL (ref 0.61–1.24)
GFR calc Af Amer: >60 mL/min (ref >60)
GFR calc non Af Amer: >60 mL/min (ref >60)
Glucose, Bld: 95 mg/dL (ref 70–99)
Potassium: 3.9 mmol/L (ref 3.5–5.1)
Sodium: 137 mmol/L (ref 135–145)
Total Bilirubin: 5.5 mg/dL — ABNORMAL HIGH (ref 0.3–1.2)
Total Protein: 6.2 g/dL — ABNORMAL LOW (ref 6.5–8.1)

## 2019-10-27 LAB — HEPATITIS PANEL, ACUTE
HCV Ab: NONREACTIVE
Hep A IgM: REACTIVE — AB
Hep B C IgM: NONREACTIVE
Hepatitis B Surface Ag: NONREACTIVE

## 2019-10-27 LAB — CBC
HCT: 31.9 % — ABNORMAL LOW (ref 39.0–52.0)
Hemoglobin: 10.9 g/dL — ABNORMAL LOW (ref 13.0–17.0)
MCH: 32.2 pg (ref 26.0–34.0)
MCHC: 34.2 g/dL (ref 30.0–36.0)
MCV: 94.4 fL (ref 80.0–100.0)
Platelets: 210 10*3/uL (ref 150–400)
RBC: 3.38 MIL/uL — ABNORMAL LOW (ref 4.22–5.81)
RDW: 14.6 % (ref 11.5–15.5)
WBC: 3.2 10*3/uL — ABNORMAL LOW (ref 4.0–10.5)
nRBC: 0 % (ref 0.0–0.2)

## 2019-10-27 LAB — URINE CULTURE: Culture: NO GROWTH

## 2019-10-27 LAB — HIV ANTIBODY (ROUTINE TESTING W REFLEX): HIV Screen 4th Generation wRfx: NONREACTIVE

## 2019-10-27 MED ORDER — OXYCODONE HCL 5 MG PO TABS
5.0000 mg | ORAL_TABLET | ORAL | Status: DC | PRN
  Administered 2019-10-27 (×2): 5 mg via ORAL
  Filled 2019-10-27 (×2): qty 1

## 2019-10-27 MED ORDER — OXYCODONE HCL 5 MG PO TABS: 5.0000 mg | ORAL_TABLET | ORAL | Status: DC | PRN

## 2019-10-27 NOTE — Progress Notes (Addendum)
Family Medicine Teaching Service Daily Progress Note Intern Pager: [phone]  Patient name: Harold Gonzales Medical record number: [id number] Date of birth: 1986-07-29 Age: 33 y.o. Gender: male  Primary Care Provider: Patient, No Pcp Per Consultants: GI Code Status: DNI  Pt Overview and Major Events to Date:  12/27 Admitted  Assessment and Plan: Harold Gonzales is a 33 y.o. male presenting with abdominal pain and elevated LFTs.  No significant past medical history.  Abdominal pain and elevated LFTs 2/2 hepatitis a: Patient with 4 to 5 days of abdominal pain with nausea and vomiting.  Patient with lower abdominal pain that radiates along the right flank on exam. Vital signs stable. Hepatitis A positive. Could have acalculous cholecystitis given his findings. Does not endorse suprapubic pain from yesterday. Could be reactive from hepatitis A infection, but given that he has not had any diarrhea or constipation may require further work-up.  -GI consulted, appreciate recommendations: Treat for hep A; supportive care they will continue to follow -We will consider repeat CT if abdominal pain not well controlled.  -Supportive care with IVF -Monitor CMP -Soft diet with phenergan -Vitals per routine -Follow-up Urine cx, blood cx  Syncope. Resolved. Patient reports that he passed out in his hot shower 4-5 times over the past 4 to 5 days. Likely due to dehydration and vasovagal. Patient does have history of smoking but no other risk factors. Is not lightheaded or dizzy. No dyspnea on exertion. -f/u EKG -Cardiac monitoring for at least 24 hours -Monitor for further symptoms -IVF as above  FEN/GI: IVF as above, trial soft diet Prophylaxis: Lovenox  Disposition: Pending further work up  Subjective:  He is continuing to have abdominal discomfort. No suprapubic pain.  Objective: Temp:  [98.5 F (36.9 C)-99 F (37.2 C)] 98.5 F (36.9 C) (12/27 2130) Pulse Rate:  [75-90] 77 (12/27  2130) Resp:  [16-18] 18 (12/27 2130) BP: (98-114)/(56-65) 114/65 (12/27 2130) SpO2:  [99 %-100 %] 100 % (12/27 2130) Weight:  [74.4 kg-79.4 kg] 74.4 kg (12/27 2130)  Physical Exam: General: Appears ill, no acute distress. Age appropriate. Sitting in bed. Cardiac: RRR, normal heart sounds, no murmurs Respiratory: CTAB, normal effort Abdomen: soft, tender, nondistended, +guarding, abnormal pulsating bowel sounds Extremities: No edema or cyanosis. Skin: Warm and dry, no rashes noted Neuro: alert and oriented, no focal deficits Psych: normal affect  Laboratory: Recent Labs  Lab 10/26/19 1553 10/27/19 0214  WBC 3.3* 3.2*  HGB 11.9* 10.9*  HCT 35.8* 31.9*  PLT 236 210   Recent Labs  Lab 10/26/19 1553 10/27/19 0214  NA 135 137  K 3.5 3.9  CL 102 103  CO2 26 27  BUN 6 5*  CREATININE 0.82 0.80  CALCIUM 8.4* 8.3*  PROT 6.7 6.2*  BILITOT 5.6*  5.8* 5.5*  ALKPHOS 203* 185*  ALT 1,469* 1,226*  AST 813* 673*  GLUCOSE 113* 95   Imaging/Diagnostic Tests: CT abdomen pelvis with diffuse periportal edema and hepatic enhancement which could be seen with hepatitis. Gallbladder was edematously thickened and some pericholecystic inflammation.  Diffuse colonic wall thickening from the transverse and sigmoid colon. Could reflect colitis of infectious or inflammatory etiology.  Bladder wall thickening noted.  RUQ performed at [facility] showed gallbladder wall thickening with pericholecystic fluid and no sonographic Murphy sign.  Gerlene Fee, DO 10/27/2019, 12:51 PM PGY-1, [place] Intern pager: [phone], text pages welcome

## 2019-10-27 NOTE — Progress Notes (Signed)
Patient refusing bed alarm. Will continue to monitor.

## 2019-10-27 NOTE — Consult Note (Signed)
Referring Provider:  [facility] Primary Care Physician:  Patient, No Pcp Per Primary Gastroenterologist: Gentry Fitz  Reason for Consultation: Abnormal LFTs  HPI: Harold Gonzales is a 33 y.o. male transfer from [facility] for further evaluation of abdominal LFTs.  Presented to outside hospital with abdominal pain, nausea and vomiting.  Found to have significant elevated LFTs with transaminases more than thousand.  Initial right upper quadrant ultrasound at the end of hospital showed gallbladder wall thickening and pericholecystic fluid.  Subsequent blood work revealed hepatitis A IgM positive.  GI is consulted for further evaluation.  Patient seen and examined at bedside.  He started noticing dark urine around 2 weeks ago.  He initially called EMS for that but was told to call his primary doctor for further evaluation.  Subsequently he started noticing right upper quadrant abdominal pain radiating towards left upper quadrant around 5 to 7 days ago associated with nausea and nonbloody vomiting.  Also noted yellowish discoloration of eyes and skin few days ago.  Denies any diarrhea constipation.  Denies any blood in the stool or black stool.  Lives with his brother.  Eat deer meat and outside food several weeks prior to noticing dark urine.  No family history of liver disease or colon cancer.  Denies alcohol and recreational drug use.   History reviewed. No pertinent past medical history.  History reviewed. No pertinent surgical history.  Prior to Admission medications   Medication Sig Start Date End Date Taking? Authorizing Provider  acetaminophen (TYLENOL) 500 MG tablet Take 1,000 mg by mouth every 6 (six) hours as needed for headache (pain).   Yes [provider]  Aspirin-Acetaminophen-Caffeine (GOODY HEADACHE PO) Take 1 packet by mouth daily as needed (pain/headache).   Yes [provider]    Scheduled Meds: . enoxaparin (LOVENOX) injection  40 mg Subcutaneous Q24H    Continuous Infusions: . sodium chloride 125 mL/hr at 10/27/19 0600   PRN Meds:.oxyCODONE, polyethylene glycol  Allergies as of 10/26/2019  . (No Known Allergies)    No family history on file.  Social History   Socioeconomic History  . Marital status: Single    Spouse name: Not on file  . Number of children: Not on file  . Years of education: Not on file  . Highest education level: Not on file  Occupational History  . Not on file  Tobacco Use  . Smoking status: Current Every Day Smoker  . Smokeless tobacco: Never Used  Substance and Sexual Activity  . Alcohol use: Never  . Drug use: Never  . Sexual activity: Not on file  Other Topics Concern  . Not on file  Social History Narrative  . Not on file   Social Determinants of Health   Financial Resource Strain:   . Difficulty of Paying Living Expenses: Not on file  Food Insecurity:   . Worried About Programme researcher, broadcasting/film/video in the Last Year: Not on file  . Ran Out of Food in the Last Year: Not on file  Transportation Needs:   . Lack of Transportation (Medical): Not on file  . Lack of Transportation (Non-Medical): Not on file  Physical Activity:   . Days of Exercise per Week: Not on file  . Minutes of Exercise per Session: Not on file  Stress:   . Feeling of Stress : Not on file  Social Connections:   . Frequency of Communication with Friends and Family: Not on file  . Frequency of Social Gatherings with Friends and Family: Not  on file  . Attends Religious Services: Not on file  . Active Member of Clubs or Organizations: Not on file  . Attends Archivist Meetings: Not on file  . Marital Status: Not on file  Intimate Partner Violence:   . Fear of Current or Ex-Partner: Not on file  . Emotionally Abused: Not on file  . Physically Abused: Not on file  . Sexually Abused: Not on file    Review of Systems: Review of Systems  Constitutional: Positive for malaise/fatigue. Negative for chills and fever.  HENT:  Negative for hearing loss and tinnitus.   Eyes: Negative for blurred vision and double vision.  Respiratory: Negative for cough and hemoptysis.   Cardiovascular: Negative for chest pain and palpitations.  Gastrointestinal: Positive for abdominal pain, nausea and vomiting. Negative for blood in stool, constipation, diarrhea and melena.  Genitourinary: Positive for flank pain. Negative for frequency.  Musculoskeletal: Positive for back pain and myalgias.  Skin: Positive for itching. Negative for rash.  Neurological: Negative for speech change and seizures.  Endo/Heme/Allergies: Does not bruise/bleed easily.  Psychiatric/Behavioral: Negative for depression. The patient is not nervous/anxious.     Physical Exam: Vital signs: Vitals:   10/26/19 2024 10/26/19 2130  BP: (!) 98/56 114/65  Pulse: 75 77  Resp: 18 18  Temp: 98.6 F (37 C) 98.5 F (36.9 C)  SpO2: 99% 100%   Last BM Date: 10/26/19 Physical Exam  Constitutional: He is oriented to person, place, and time. He appears well-developed and well-nourished.  HENT:  Head: Normocephalic and atraumatic.  Eyes: EOM are normal. Scleral icterus is present.  Cardiovascular: Normal rate, regular rhythm and normal heart sounds.  Pulmonary/Chest: Effort normal and breath sounds normal. No respiratory distress.  Abdominal: Soft. Bowel sounds are normal. He exhibits no distension. There is abdominal tenderness. There is no rebound and no guarding.  RUQ TTP  Musculoskeletal:        General: No edema. Normal range of motion.     Cervical back: Normal range of motion and neck supple.     Comments: right upper extremity amputation noted  Neurological: He is alert and oriented to person, place, and time.  Skin: Skin is warm. No erythema.  Psychiatric: He has a normal mood and affect. Judgment normal.    GI:  Lab Results: Recent Labs    10/26/19 1553 10/27/19 0214  WBC 3.3* 3.2*  HGB 11.9* 10.9*  HCT 35.8* 31.9*  PLT 236 210    BMET Recent Labs    10/26/19 1553 10/27/19 0214  NA 135 137  K 3.5 3.9  CL 102 103  CO2 26 27  GLUCOSE 113* 95  BUN 6 5*  CREATININE 0.82 0.80  CALCIUM 8.4* 8.3*   LFT Recent Labs    10/26/19 1553 10/27/19 0214  PROT 6.7 6.2*  ALBUMIN 2.8* 2.6*  AST 813* 673*  ALT 1,469* 1,226*  ALKPHOS 203* 185*  BILITOT 5.6*  5.8* 5.5*  BILIDIR 3.6*  --   IBILI 2.0*  --    PT/INR Recent Labs    10/26/19 2223  LABPROT 14.0  INR 1.1     Studies/Results: No results found.  Impression/Plan: -Abnormal LFTs with ALT of 1469, AST 23, T bili 5.6 and alkaline phosphatase 203.  Normal INR.  Alert and oriented x3.  Most likely from hepatitis A. -Acute hepatitis A  Recommendations ------------------------- -Diagnosis of hepatitis A discussed with the patient.  Fecal-oral route of transmission discussed.  Personal hygiene and hand hygiene to  prevent spread to close contacts and relatives discussed.  -[facility].  File does not have any paper records  -Continue supportive care.  Advance diet as tolerated.  Repeat LFTs and INR in the morning.  -GI will follow   LOS: 1 day   Kathi DerParag Marley Pakula  MD, FACP 10/27/2019, 8:29 AM  Contact #  [phone]

## 2019-10-27 NOTE — Discharge Summary (Signed)
[facility] Discharge Summary  Patient name: Harold Gonzales Medical record number: [id number] Date of birth: October 07, 1986 Age: 33 y.o. Gender: male Date of Admission: 10/26/2019  Date of Discharge: 10/27/2019 Admitting Physician: Gonzales Shirley, DO  Primary Care Provider: Patient, No Pcp Per Consultants: GI  Indication for Hospitalization: Elevated LFTs  Discharge Diagnoses/Problem List:  Transaminitis Acute Hepatitis  Disposition: Home  Discharge Condition: AMA  Discharge Exam:   General: Appears upset, but no acute distress. Age appropriate. Lying in bed.  Respiratory: normal effort Neuro: alert and oriented, no focal deficits Psych: agitated, dismissive  Brief Hospital Course:  Harold Gonzales a 33 y.o.maleadmitted for abdominal pain and elevated LFTs.No significant past medical history.  Abdominal pain and elevated LFTs2/2hepatitis A Patient was with 4 to 5 days of abdominal pain with nausea and vomiting. Prior CT showed diffuse periportal edema and hepatic enhancement which could be seen with hepatitis. Gallbladder was edematouslythickened and some pericholecystic inflammation. Diffuse colonic wall thickening from the transverse and sigmoid colon. On admission AST/ALT 813/1469. GGT 93. LDH 320. UA w/ mod bilirubin. GI was consulted and recommended supportive therapy for Hep A and agreed to follow his progress during his stay. Pain moderately controlled with oxycodone 4mg  q4h. Repeat CT was suggested but patient left AMA the next day after admission.   Syncope. Resolved. Patient stated episode prior to admission in hot shower. No syncopal episodes during admission. Treated with maintenance fluids and resolved shortly after admission.   Issues for Follow Up:  1. Hospital follow up with PCP, recheck LFTs. 2. Repeat CT scan if abdominal pain persists beyond Hepatitis A resolution.  Significant Procedures: N/A  Significant Labs and  Imaging:  Recent Labs  Lab 10/26/19 1553 10/27/19 0214  WBC 3.3* 3.2*  HGB 11.9* 10.9*  HCT 35.8* 31.9*  PLT 236 210   Recent Labs  Lab 10/26/19 1553 10/27/19 0214  NA 135 137  K 3.5 3.9  CL 102 103  CO2 26 27  GLUCOSE 113* 95  BUN 6 5*  CREATININE 0.82 0.80  CALCIUM 8.4* 8.3*  ALKPHOS 203* 185*  AST 813* 673*  ALT 1,469* 1,226*  ALBUMIN 2.8* 2.6*     Results/Tests Pending at Time of Discharge: N/A  Discharge Medications:  Allergies as of 10/27/2019   No Known Allergies     Medication List    ASK your doctor about these medications   acetaminophen 500 MG tablet Commonly known as: TYLENOL Take 1,000 mg by mouth every 6 (six) hours as needed for headache (pain).   GOODY HEADACHE PO Take 1 packet by mouth daily as needed (pain/headache).       Discharge Instructions: Please refer to Patient Instructions section of EMR for full details.  Patient was counseled important signs and symptoms that should prompt return to medical care, changes in medications, dietary instructions, activity restrictions, and follow up appointments.   Follow-Up Appointments:  No future appointments.   Gerlene Fee, DO 10/30/2019, 10:27 AM PGY-1, [place]

## 2019-10-27 NOTE — Progress Notes (Signed)
Patient was agitated and wanted to speak to the doctor in person and was not ready to talk to me, paged the doctor.  As per the resident, he was not letting her explain or talk regarding the plan of care and was calling someone on phone to pick him up.  He signed AMA form, Peripheral IV and tele box removed.  He walked out of the unit refused to accompany him.

## 2019-10-27 NOTE — Progress Notes (Signed)
Patient agitated and removed Tele box, refusing to put it back.

## 2019-10-27 NOTE — Progress Notes (Signed)
FPTS Interim Progress Note  S: [name] was on the phone with someone asking them to come pick him up that we are not doing anything for him and not giving him any pain medication. When asked what his concerns were he said he no long wants our services. That we denied him being able to walk in the hallway, take a shower, or to get pain medication. He said he is done talking, does not want any more pain medication, and that we are incapable of explaining to him what is wrong with him. When asked if he wanted me to explain he said no. When asked if he would stay if he had the green light to shower he said no. When asked if he wanted me to increase the dose or frequency of the pain medication he said no. He said he is done talking and wants his discharge papers. When asked if he understood that he would be signing out against medical advice he voiced understanding and once again asked to sign the papers.  O: BP 96/76 (BP Location: Left Arm)   Pulse 73   Temp 99 F (37.2 C) (Oral)   Resp 15   Ht 6\' 2"  (1.88 m)   Wt 74.4 kg   SpO2 98%   BMI 21.06 kg/m    General: Appears upset, but no acute distress. Age appropriate. Lying in bed.  Respiratory: normal effort Neuro: alert and oriented, no focal deficits Psych: agitated, dismissive  A/P: He plans to sign out against medical advice. Unresolved Acute hepatitis A.   Gerlene Fee, DO 10/27/2019, 5:45 PM PGY-1, [facility] pager [phone]

## 2019-10-28 LAB — HEPATITIS B SURFACE ANTIBODY, QUANTITATIVE: Hep B S AB Quant (Post): 118 m[IU]/mL (ref >9.9)

## 2019-10-31 LAB — CULTURE, BLOOD (ROUTINE X 2)
Culture: NO GROWTH
Culture: NO GROWTH
Special Requests: ADEQUATE

## 2023-12-28 ENCOUNTER — Ambulatory Visit: Payer: Medicaid Other

## 2023-12-28 NOTE — Progress Notes (Signed)
 [facility]. Privacy practices reviewed, HIPAA form signed. Appt made for Cardiology on Monday 3.3.25 at 11:00. At [address] [place] [address]

## 2023-12-28 NOTE — Patient Instructions (Addendum)
 Follow up: [address] [place], [place] [address] 11:00 Phone [phone]
